# Patient Record
Sex: Male | Born: 1965 | Race: White | Hispanic: No | Marital: Married | Smoking: Former smoker
Health system: Southern US, Community
[De-identification: ages and names within clinical notes are randomized; demographics above are authoritative.]

## PROBLEM LIST (undated history)

## (undated) DIAGNOSIS — K219 Gastro-esophageal reflux disease without esophagitis: Secondary | ICD-10-CM

## (undated) DIAGNOSIS — Z9989 Dependence on other enabling machines and devices: Secondary | ICD-10-CM

## (undated) DIAGNOSIS — G4733 Obstructive sleep apnea (adult) (pediatric): Secondary | ICD-10-CM

## (undated) HISTORY — DX: Gastro-esophageal reflux disease without esophagitis: K21.9

## (undated) HISTORY — PX: OTHER SURGICAL HISTORY: SHX169

## (undated) HISTORY — DX: Dependence on other enabling machines and devices: Z99.89

## (undated) HISTORY — DX: Obstructive sleep apnea (adult) (pediatric): G47.33

---

## 2008-08-16 ENCOUNTER — Ambulatory Visit (HOSPITAL_COMMUNITY): Admission: RE | Admit: 2008-08-16 | Discharge: 2008-08-16 | Payer: Self-pay | Admitting: *Deleted

## 2008-08-16 ENCOUNTER — Encounter (INDEPENDENT_AMBULATORY_CARE_PROVIDER_SITE_OTHER): Payer: Self-pay | Admitting: *Deleted

## 2010-06-03 NOTE — Op Note (Signed)
Cole Simpson, Cole Simpson                ACCOUNT NO.:  [id number]   MEDICAL RECORD NO.:  [id number]          PATIENT TYPE:  AMB   LOCATION:  ENDO                         FACILITY:  [facility]   PHYSICIAN:  Georgiana Spinner, M.D.    DATE OF BIRTH:  08/07/1965   DATE OF PROCEDURE:  08/16/2008  DATE OF DISCHARGE:                               OPERATIVE REPORT   PROCEDURE:  Upper endoscopy with biopsy.   ENDOSCOPIST:  Georgiana Spinner, M.D.   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  He  had subsequently ablation therapy in 2005 and 2006.  He has had every-  six-month evaluations up until now.  He is due for a repeat endoscopy at  this time.   ANESTHESIA:  Fentanyl 100 mcg, Versed 10 mg.   DESCRIPTION OF PROCEDURE:  With the patient mildly sedated in the left  lateral decubitus position, the Pentax videoscopic endoscope was  inserted in the mouth and passed under direct vision through the  esophagus, which appeared normal.  Photographs were taken.  We entered  into the stomach through a hiatal hernia.  The fundus, body, antrum,  duodenal bulb and second portion of the duodenum were visualized.  From  this point the endoscope was slowly withdrawn, taking circumferential  views of the duodenal mucosa until the endoscope had been pulled back  into the stomach and placed in retroflexion, to view the stomach from  below.  There was one questionable button hole area of possible  Barrett's that was photographed and biopsied.  The endoscope was then  straightened and withdrawn, taking circumferential views of the  remaining gastric and esophageal mucosa.  The patient's vital signs and  pulse oximeter remained stable.   The patient tolerated the procedure well without apparent complications.   FINDINGS:  Hiatal hernia with one questionable area of possible  Barrett's, which may be a normal variant.  Await  biopsy report.  The  patient will call me for the results, and will follow up with  gastroenterology for at least an annual examination for the next five  years.           ______________________________  Georgiana Spinner, M.D.     GMO/MEDQ  D:  08/16/2008  T:  08/16/2008  Job:  [id number]

## 2015-11-21 DIAGNOSIS — Z0000 Encounter for general adult medical examination without abnormal findings: Secondary | ICD-10-CM | POA: Diagnosis not present

## 2015-11-21 DIAGNOSIS — Z23 Encounter for immunization: Secondary | ICD-10-CM | POA: Diagnosis not present

## 2016-01-20 DIAGNOSIS — Z9989 Dependence on other enabling machines and devices: Secondary | ICD-10-CM

## 2016-01-20 DIAGNOSIS — G4733 Obstructive sleep apnea (adult) (pediatric): Secondary | ICD-10-CM

## 2016-01-20 HISTORY — DX: Dependence on other enabling machines and devices: Z99.89

## 2016-01-20 HISTORY — DX: Obstructive sleep apnea (adult) (pediatric): G47.33

## 2016-05-22 DIAGNOSIS — R7301 Impaired fasting glucose: Secondary | ICD-10-CM | POA: Diagnosis not present

## 2016-05-27 DIAGNOSIS — Z0000 Encounter for general adult medical examination without abnormal findings: Secondary | ICD-10-CM | POA: Diagnosis not present

## 2016-07-20 DIAGNOSIS — K2270 Barrett's esophagus without dysplasia: Secondary | ICD-10-CM | POA: Diagnosis not present

## 2016-07-20 DIAGNOSIS — K449 Diaphragmatic hernia without obstruction or gangrene: Secondary | ICD-10-CM | POA: Diagnosis not present

## 2016-08-06 DIAGNOSIS — K2270 Barrett's esophagus without dysplasia: Secondary | ICD-10-CM | POA: Diagnosis not present

## 2016-08-06 DIAGNOSIS — K449 Diaphragmatic hernia without obstruction or gangrene: Secondary | ICD-10-CM | POA: Diagnosis not present

## 2016-08-06 DIAGNOSIS — K317 Polyp of stomach and duodenum: Secondary | ICD-10-CM | POA: Diagnosis not present

## 2016-11-02 DIAGNOSIS — R0683 Snoring: Secondary | ICD-10-CM | POA: Diagnosis not present

## 2016-11-30 DIAGNOSIS — Z125 Encounter for screening for malignant neoplasm of prostate: Secondary | ICD-10-CM | POA: Diagnosis not present

## 2016-11-30 DIAGNOSIS — Z0000 Encounter for general adult medical examination without abnormal findings: Secondary | ICD-10-CM | POA: Diagnosis not present

## 2016-12-04 DIAGNOSIS — Z0000 Encounter for general adult medical examination without abnormal findings: Secondary | ICD-10-CM | POA: Diagnosis not present

## 2016-12-04 DIAGNOSIS — R7301 Impaired fasting glucose: Secondary | ICD-10-CM | POA: Diagnosis not present

## 2016-12-14 ENCOUNTER — Encounter: Payer: Self-pay | Admitting: Neurology

## 2016-12-15 ENCOUNTER — Encounter: Payer: Self-pay | Admitting: Neurology

## 2016-12-15 ENCOUNTER — Encounter (INDEPENDENT_AMBULATORY_CARE_PROVIDER_SITE_OTHER): Payer: Self-pay

## 2016-12-15 ENCOUNTER — Ambulatory Visit: Payer: BLUE CROSS/BLUE SHIELD | Admitting: Neurology

## 2016-12-15 VITALS — BP 145/95 | HR 73 | Ht 65.0 in | Wt 217.0 lb

## 2016-12-15 DIAGNOSIS — G4733 Obstructive sleep apnea (adult) (pediatric): Secondary | ICD-10-CM

## 2016-12-15 DIAGNOSIS — R0683 Snoring: Secondary | ICD-10-CM | POA: Insufficient documentation

## 2016-12-15 NOTE — Progress Notes (Signed)
SLEEP MEDICINE CLINIC   Provider:  Melvyn Novasarmen  Abdulraheem Pineo, M D  Primary Care Physician:  Pearson GrippeKim, James, MD   Referring Provider: Pearson GrippeKim, James, MD   Chief Complaint  Patient presents with  . New Patient (Initial Visit)    pt alone, rm 10. pt has been told that he snores bad and gasp for air. he wakes up not feeling as rested.Marland Kitchen. He has 3 brothers that have sleep apnea.     HPI:  Cole Simpson is a 51 y.o. male , seen here  in a referral  from Dr. Selena BattenKim for a sleep consultation.  I had the pleasure of meeting Cole Simpson today, a 51 year old Caucasian, married, left-handed male patient of Dr. Pearson GrippeJames Kim on 15 December 2016. The patient is married to a nurse and reports that he has also occurred that his brothers have noted his snoring.  He has 4 brothers of which 3 are on CPAP. He has seasonal allergies culminating and allergic rhinitis, enlarged adenoids.  Aside from snoring and allergies he has suffered from GERD.  Chief complaint according to patient :" my wife and children are bothered by my snoring and I feel more tired in the morning"  Sleep habits are as follows:  The patient's usual bedtime is between 10 and 11 PM, his bedroom is cool, quiet and dark and he shares it with his wife.  He sleeps on 2 pillows usually on his sides, his wife has noticed that snoring is louder when he rests on his right side.   He has once a night a bathroom break.  Otherwise he will sleeps through until the morning hours when he spontaneously awakens around 5:50 AM, he also has an alarm set. He cannot recall vivid dreams, at least no lately.  He is just not as ready to start the day.  He awakens with a dry mouth, but neither headaches, no nausea no dizziness were endorsed.  He does not have palpitations, he is not sweaty or clammy and he does not have chest pain.   Sleep medical history and family sleep history: Cole Simpson has no history of sleepwalking, night terrors or enuresis.  He never underwent  tonsillectomy.  As mentioned above 3 of his 4 brothers use CPAP.  He also has 2 sisters.  Social history: married to a Charity fundraiserN, 3 children, all 3 sons in school, works for [employer]. He has daylight exposure at his workplace. Open office. Standing desk.  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  He smoked for about 2 or 3 years his 2420s but has not smoked in over 25 years.  He drinks usually only on weekends less than 7 drinks a week.  He drinks about 28 ounces of coffee per day, no energy drinks, sodas, rarely iced tea. Regular gym attendance.     Review of Systems: Out of a complete 14 system review, the patient complains of only the following symptoms, and all other reviewed systems are negative.  Snoring. Gasping , weight gain.  Epworth score  4 , Fatigue severity score 20  ,    Social History   Socioeconomic History  . Marital status: Married    Spouse name: Not on file  . Number of children: Not on file  . Years of education: Not on file  . Highest education level: Not on file  Social Needs  . Financial resource strain: Not on file  . Food insecurity - worry: Not  on file  . Food insecurity - inability: Not on file  . Transportation needs - medical: Not on file  . Transportation needs - non-medical: Not on file  Occupational History  . Not on file  Tobacco Use  . Smoking status: Former Games developermoker  . Smokeless tobacco: Never Used  . Tobacco comment: quit 20 years ago  Substance and Sexual Activity  . Alcohol use: Yes    Comment: occasionally  . Drug use: No  . Sexual activity: Not on file  Other Topics Concern  . Not on file  Social History Narrative  . Not on file    Family History  Problem Relation Age of Onset  . Brain cancer Mother   . Heart failure Father     Past Medical History:  Diagnosis Date  . GERD (gastroesophageal reflux disease)     Current Outpatient Medications  Medication Sig Dispense Refill  . Calcium Carb-Cholecalciferol  (CALCIUM 600 + D) 600-200 MG-UNIT TABS Take 1 tablet by mouth daily.    . fluticasone (FLONASE) 50 MCG/ACT nasal spray Place into both nostrils daily.    Marland Kitchen. GLUCOSAMINE-CHONDROIT-MSM-C-MN PO Take 1 tablet by mouth daily.    Boris Lown. Krill Oil 300 MG CAPS Take by mouth.    . loratadine (CLARITIN) 10 MG tablet Take 10 mg by mouth daily.    . Multiple Vitamin (MULTIVITAMIN) tablet Take 1 tablet by mouth daily.    Marland Kitchen. omeprazole (PRILOSEC) 20 MG capsule Take 20 mg by mouth 2 (two) times daily before a meal.     . SILDENAFIL CITRATE PO Take 20 mg by mouth as needed.    . vitamin C (ASCORBIC ACID) 500 MG tablet Take 500 mg by mouth daily.     No current facility-administered medications for this visit.     Allergies as of 12/15/2016  . (Not on File)    Vitals: BP (!) 145/95   Pulse 73   Ht 5\' 5"  (1.651 m)   Wt 217 lb (98.4 kg)   BMI 36.11 kg/m  Last Weight:  Wt Readings from Last 1 Encounters:  12/15/16 217 lb (98.4 kg)   ZOX:WRUEBMI:Body mass index is 36.11 kg/m.     Last Height:   Ht Readings from Last 1 Encounters:  12/15/16 5\' 5"  (1.651 m)    Physical exam:  General: The patient is awake, alert and appears not in acute distress. The patient is well groomed. Head: Normocephalic, atraumatic. Neck is supple. Mallampati 4 neck circumference:17. Nasal airflow congested . Retrognathia is seen.  Cardiovascular:  Regular rate and rhythm , without  murmurs or carotid bruit, and without distended neck veins. Respiratory: Lungs are clear to auscultation. Skin:  Without evidence of edema, or rash Trunk: BMI is 36. The patient's posture is erect   Neurologic exam : The patient is awake and alert, oriented to place and time.     Attention span & concentration ability appears normal.  Speech is fluent,  without  dysarthria, dysphonia or aphasia.  Mood and affect are appropriate.  Cranial nerves:  intact smell and taste. Pupils are equal and briskly reactive to light. Funduscopic exam without evidence  of pallor or edema. Extraocular movements  in vertical and horizontal planes intact and without nystagmus. Visual fields by finger perimetry are intact. Hearing to finger rub intact.  Facial sensation intact to fine touch. Facial motor strength is symmetric and tongue and uvula move midline. Shoulder shrug was symmetrical.   Motor exam:  Normal tone, muscle bulk and symmetric  strength in all extremities. Sensory:  Fine touch, pinprick and vibration were tested in all extremities. Proprioception tested in the upper extremities was normal. Coordination: Rapid alternating movements in the fingers/hands was normal. Finger-to-nose maneuver  normal without evidence of ataxia, dysmetria or tremor. Gait and station: Patient walks without assistive device and is able unassisted to climb up to the exam table. Strength within normal limits.Stance is stable and normal.  Turns with 3 Steps.   Deep tendon reflexes in the upper and lower extremities are symmetric and intact. Babinski maneuver response is downgoing.    Assessment:  After physical and neurologic examination, review of laboratory studies,  Personal review of imaging studies, reports of other /same  Imaging studies, results of polysomnography and / or neurophysiology testing and pre-existing records as far as provided in visit., my assessment is   1) Snoring and witnessed apnea in a patient without co-morbidities , but with anatomical risk factors( neck 17 , Mallampati, congested nasion) and strong family history.   The patient was advised of the nature of the diagnosed disorder , the treatment options and the  risks for general health and wellness arising from not treating the condition.   I spent more than 30 minutes of face to face time with the patient.  Greater than 50% of time was spent in counseling and coordination of care. We have discussed the diagnosis and differential and I answered the patient's questions.    Plan:  Treatment plan and  additional workup : HST ASAP- patient needs to be tested and in therapy before 01-18-2017.    Melvyn Novas, MD 12/15/2016, 8:16 AM  Certified in Neurology by ABPN Certified in Sleep Medicine by [facility] [address], [address] [place], [place] [address]

## 2016-12-15 NOTE — Patient Instructions (Signed)

## 2016-12-17 ENCOUNTER — Institutional Professional Consult (permissible substitution): Payer: Self-pay | Admitting: Neurology

## 2017-01-04 ENCOUNTER — Ambulatory Visit (INDEPENDENT_AMBULATORY_CARE_PROVIDER_SITE_OTHER): Payer: BLUE CROSS/BLUE SHIELD | Admitting: Neurology

## 2017-01-04 DIAGNOSIS — G4733 Obstructive sleep apnea (adult) (pediatric): Secondary | ICD-10-CM

## 2017-01-04 DIAGNOSIS — R0683 Snoring: Secondary | ICD-10-CM

## 2017-01-12 ENCOUNTER — Other Ambulatory Visit: Payer: Self-pay | Admitting: Neurology

## 2017-01-12 DIAGNOSIS — R0683 Snoring: Secondary | ICD-10-CM

## 2017-01-12 DIAGNOSIS — M2619 Other specified anomalies of jaw-cranial base relationship: Secondary | ICD-10-CM

## 2017-01-12 DIAGNOSIS — G4733 Obstructive sleep apnea (adult) (pediatric): Secondary | ICD-10-CM

## 2017-01-12 NOTE — Procedures (Signed)
[facility] [address] [place], [place] [address] NAME:  [name]                                                        DOB: 1965/11/30 MEDICAL RECORD NUMBER  [id number]                                   DOS: 01/04/2017- loaded 01-10-2017 REFERRING PHYSICIAN: Pearson GrippeJames Kim, M.D.   STUDY PERFORMED: HST on Watch Pat HISTORY: Mr. Cole HumphreyRobert J Simpson, a 51 year old Caucasian, married, left-handed male patient of Dr. Pearson GrippeJames Kim, is married to a nurse who reports that he has apnea. Similar observations have been made by his brothers, have noted his loud snoring.  He has 4 brothers of which 3 are on CPAP. He has seasonal allergies culminating and allergic rhinitis, enlarged adenoids and GERD. Epworth Sleepiness score endorsed at 4 points, Fatigue severity score at 20 points, BMI was 36.4    STUDY RESULTS: Total Recording Time: 7 hours, 19 minutes Total Apnea/Hypopnea Index (AHI):  15.9 /hr., RDI was 19.8/hr.  Average Oxygen Saturation: SpO2 93%; Lowest Oxygen Desaturation: 88%  Total Time Oxygen Saturation Below 89% was 0.0 minutes.  Average Heart Rate:   72 bpm (54-108 bpm)   IMPRESSION: Mild form of Obstructive Sleep Apnea with moderate Snoring.  Not associated with Hypoxemia nor associated with Tachy -Brady Arrhythmia.  RECOMMENDATION:  I will order auto CPAP 5-15 cm water with 3 cm EPR, heated humidity and mask of patient's choice.  Since the patient has Retrognathia, please consider a nasal mask or pillow. This uncomplicated OSA can be treated with a dental device, too- if CPAP is not tolerated.   I certify that I have reviewed the raw data recording prior to the issuance of this report in accordance with the standards of the American Academy of Sleep Medicine (AASM). Melvyn Novasarmen Shakeya Kerkman, M.D.   01-12-2017   Medical Director of [facility], Diplomat of ABPN and ABSM and accredited by AASM

## 2017-01-13 ENCOUNTER — Telehealth: Payer: Self-pay | Admitting: Neurology

## 2017-01-13 NOTE — Telephone Encounter (Signed)
Called patient to discuss sleep study results. No answer at this time. LVM for the patient to call back.   

## 2017-01-13 NOTE — Telephone Encounter (Signed)
-----   Message from Melvyn Novasarmen Dohmeier, MD sent at 01/12/2017  2:05 PM EST ----- Uncomplicated, straight forward form of mild OSA, AHI 15.9, loud snoring ( RDI was 19.8/hr.) I recommend to use auto CPAP with a non full face mask - 5- 15 cm water, 3 cm EPR and mask of choice ( retrognathia ! Use nasal or nasal pillow) .

## 2017-01-15 NOTE — Telephone Encounter (Signed)
I called pt. I advised pt that Dr. Vickey Hugerohmeier reviewed their sleep study results and found that pt has sleep apnea. Dr. Vickey Hugerohmeier recommends that pt starts a auto CPAP. I reviewed PAP compliance expectations with the pt. Pt is agreeable to starting an auto-PAP. I advised pt that an order will be sent to a DME, [facility], and [facility] will call the pt within about one week after they file with the pt's insurance. [facility] will show the pt how to use the machine, fit for masks, and troubleshoot the auto-PAP if needed. A follow up appt was made for insurance purposes with Darrol Angelarolyn Martin, NP on April 15, 2017 at 8:45 am. Pt verbalized understanding to arrive 15 minutes early and bring their auto-PAP. A letter with all of this information in it will be mailed to the pt as a reminder. I verified with the pt that the address we have on file is correct. Pt verbalized understanding of results. Pt had no questions at this time but was encouraged to call back if questions arise.

## 2017-01-26 ENCOUNTER — Telehealth: Payer: Self-pay | Admitting: Neurology

## 2017-01-26 NOTE — Telephone Encounter (Signed)
Spoke with [facility] and they will contact the patient today.

## 2017-01-26 NOTE — Telephone Encounter (Signed)
Pt lm stating he was told he would be referred to a company to get equipment. I am assuming pt has yet to hear from DME company. I wanted to check with you first to make sure order was sent.

## 2017-01-26 NOTE — Telephone Encounter (Signed)
Called the patient to verify that [employer] did reach out to them. Pt states that they did and he appreciated me reaching out to them

## 2017-01-29 DIAGNOSIS — K219 Gastro-esophageal reflux disease without esophagitis: Secondary | ICD-10-CM | POA: Diagnosis not present

## 2017-02-01 ENCOUNTER — Telehealth: Payer: Self-pay | Admitting: Neurology

## 2017-02-01 NOTE — Telephone Encounter (Signed)
Called the patient and made him aware that I was told by [employer] that they personally spoke with him on 8th per my documentation.  The patient voiced that since that last spoke with [facility] he was told that they would reach out to him once he got insurance approval and he has yet to hear from them and its been a wk. I informed him that it can sometimes take a while for insurance approval. I asked it the patient had spoke with [employer] and he states no he just called us. I instructed him that he needs to call and speak with [employer] and voice his concerns to them. I offered to also send a email to them and make them aware.  I received an email that [employer] has scheduled him today.

## 2017-02-01 NOTE — Telephone Encounter (Signed)
Pt lvm stating he has not heard from [employer] since two weeks ago when they told him they had to contact his insurance. He stated at this time he is unhappy with [employer]. He would like for you to give him a call back. He gave his work number which is [phone] and also his cell number.

## 2017-02-05 DIAGNOSIS — G4733 Obstructive sleep apnea (adult) (pediatric): Secondary | ICD-10-CM | POA: Diagnosis not present

## 2017-03-08 DIAGNOSIS — G4733 Obstructive sleep apnea (adult) (pediatric): Secondary | ICD-10-CM | POA: Diagnosis not present

## 2017-04-05 DIAGNOSIS — G4733 Obstructive sleep apnea (adult) (pediatric): Secondary | ICD-10-CM | POA: Diagnosis not present

## 2017-04-06 NOTE — Progress Notes (Signed)
[facility]  PATIENT: Cole Simpson   REASON FOR VISIT: Follow-up for obstructive sleep apnea with initial CPAP HISTORY FROM: Patient    HISTORY OF PRESENT ILLNESS:UPDATE 3/28/2019CM Cole Simpson, 52 year old male returns for follow-up for initial CPAP compliance after recently being diagnosed with obstructive sleep apnea.  He is having no problems with adjustment.  He has nasal pillows.  Compliance data dated 03/16/2017-04/14/2017 shows compliance greater than 4 hours for 30 days at 100%.  Average usage 7 hours 37 minutes.  Set pressure 5-15 cm.  EPR level 3 AHI 0.8.  ESS 2.  He returns for reevaluation 12/15/17 CDRobert J Cole Simpson is a 52 y.o. male , seen here  in a referral  from Dr. Selena BattenKim for a sleep consultation.  I had the pleasure of meeting Cole Simpson today, a 52 year old Caucasian, married, left-handed male patient of Dr. Pearson GrippeJames Kim on 15 December 2016. The patient is married to a nurse and reports that he has also occurred that his brothers have noted his snoring.  He has 4 brothers of which 3 are on CPAP. He has seasonal allergies culminating and allergic rhinitis, enlarged adenoids.  Aside from snoring and allergies he has suffered from GERD.  Chief complaint according to patient :" my wife and children are bothered by my snoring and I feel more tired in the morning"  Sleep habits are as follows:  The patient's usual bedtime is between 10 and 11 PM, his bedroom is cool, quiet and dark and he shares it with his wife.  He sleeps on 2 pillows usually on his sides, his wife has noticed that snoring is louder when he rests on his right side.   He has once a night a bathroom break.  Otherwise he will sleeps through until the morning hours when he spontaneously awakens around 5:50 AM, he also has an alarm set. He cannot recall vivid dreams, at least no lately.  He is just not as ready to start the day.  He awakens with a dry mouth, but neither  headaches, no nausea no dizziness were endorsed.  He does not have palpitations, he is not sweaty or clammy and he does not have chest pain.   REVIEW OF SYSTEMS: Full 14 system review of systems performed and notable only for those listed, all others are neg:  Constitutional: neg  Cardiovascular: neg Ear/Nose/Throat: neg  Skin: neg Eyes: neg Respiratory: neg Gastroitestinal: neg  Hematology/Lymphatic: neg  Endocrine: neg Musculoskeletal:neg Allergy/Immunology: neg Neurological: neg Psychiatric: neg Sleep : Obstructive sleep apnea new to CPAP   ALLERGIES: No Known Allergies  HOME MEDICATIONS: Outpatient Medications Prior to Visit  Medication Sig Dispense Refill  . Calcium Carb-Cholecalciferol (CALCIUM 600 + D) 600-200 MG-UNIT TABS Take 1 tablet by mouth daily.    . fluticasone (FLONASE) 50 MCG/ACT nasal spray Place into both nostrils daily.    Marland Kitchen. GLUCOSAMINE-CHONDROIT-MSM-C-MN PO Take 1 tablet by mouth daily.    Cole Simpson. Krill Oil 300 MG CAPS Take by mouth.    . loratadine (CLARITIN) 10 MG tablet Take 10 mg by mouth daily.    . Multiple Vitamin (MULTIVITAMIN) tablet Take 1 tablet by mouth daily.    Marland Kitchen. omeprazole (PRILOSEC) 20 MG capsule Take 20 mg by mouth 2 (two) times daily before a meal.     . SILDENAFIL CITRATE PO Take 20 mg by mouth as needed.    . vitamin C (ASCORBIC ACID) 500 MG tablet Take 500 mg by mouth daily.  No facility-administered medications prior to visit.     PAST MEDICAL HISTORY: Past Medical History:  Diagnosis Date  . GERD (gastroesophageal reflux disease)   . OSA on CPAP 2018    PAST SURGICAL HISTORY: History reviewed. No pertinent surgical history.  FAMILY HISTORY: Family History  Problem Relation Age of Onset  . Brain cancer Mother   . Heart failure Father     SOCIAL HISTORY: Social History   Socioeconomic History  . Marital status: Married    Spouse name: Not on file  . Number of children: Not on file  . Years of education: Not on file   . Highest education level: Not on file  Occupational History  . Not on file  Social Needs  . Financial resource strain: Not on file  . Food insecurity:    Worry: Not on file    Inability: Not on file  . Transportation needs:    Medical: Not on file    Non-medical: Not on file  Tobacco Use  . Smoking status: Former Games developer  . Smokeless tobacco: Never Used  . Tobacco comment: quit 20 years ago  Substance and Sexual Activity  . Alcohol use: Yes    Comment: occasionally  . Drug use: No  . Sexual activity: Not on file  Lifestyle  . Physical activity:    Days per week: Not on file    Minutes per session: Not on file  . Stress: Not on file  Relationships  . Social connections:    Talks on phone: Not on file    Gets together: Not on file    Attends religious service: Not on file    Active member of club or organization: Not on file    Attends meetings of clubs or organizations: Not on file    Relationship status: Not on file  . Intimate partner violence:    Fear of current or ex partner: Not on file    Emotionally abused: Not on file    Physically abused: Not on file    Forced sexual activity: Not on file  Other Topics Concern  . Not on file  Social History Narrative  . Not on file     PHYSICAL EXAM  Vitals:   04/15/17 0821  BP: (!) 150/81  Pulse: 74  Weight: 210 lb 9.6 oz (95.5 kg)   Body mass index is 35.05 kg/m.  Generalized: Well developed, obese male in no acute distress  Head: normocephalic and atraumatic,. Oropharynx benign mallopatti 4 Neck: Supple, neck circumference 17 Musculoskeletal: No deformity   Neurological examination   Mentation: Alert oriented to time, place, history taking. Attention span and concentration appropriate. Recent and remote memory intact.  Follows all commands speech and language fluent.   Cranial nerve II-XII: Pupils were equal round reactive to light extraocular movements were full, visual field were full on confrontational  test. Facial sensation and strength were normal. hearing was intact to finger rubbing bilaterally. Uvula tongue midline. head turning and shoulder shrug were normal and symmetric.Tongue protrusion into cheek strength was normal. Motor: normal bulk and tone, full strength in the BUE, BLE, fine finger movements normal, no pronator drift. No focal weakness Sensory: normal and symmetric to light touch,  Coordination: finger-nose-finger, heel-to-shin bilaterally, no dysmetria Gait and Station: Rising up from seated position without assistance, normal stance DIAGNOSTIC DATA (LABS, IMAGING, TESTING) - I reviewed patient records, labs, notes, testing and imaging myself where available.   ASSESSMENT AND PLAN  52 y.o. year old male  has a past medical history of GERD (gastroesophageal reflux disease) and OSA on CPAP (2018). here for initial CPAP compliance.Data dated 03/16/2017-04/14/2017 shows compliance greater than 4 hours for 30 days at 100%.  Average usage 7 hours 37 minutes.  Set pressure 5-15 cm.  EPR level 3 AHI 0.8.  ESS 2   CPAP compliance 100% reviewed data with patient Continue same settings Follow-up yearly and as needed I spent 17 min in total face to face time with the patient more than 50% of which was spent counseling and coordination of care, reviewing test results reviewing medications and discussing and reviewing the diagnosis of obstructive sleep apnea and reviewing CPAP compliance Nilda Riggs, [facility], [facility], APRN  [facility] [address], [address] [place], [place] [address] [phone]

## 2017-04-14 ENCOUNTER — Encounter: Payer: Self-pay | Admitting: Nurse Practitioner

## 2017-04-15 ENCOUNTER — Ambulatory Visit: Payer: BLUE CROSS/BLUE SHIELD | Admitting: Nurse Practitioner

## 2017-04-15 ENCOUNTER — Encounter: Payer: Self-pay | Admitting: Nurse Practitioner

## 2017-04-15 DIAGNOSIS — Z9989 Dependence on other enabling machines and devices: Secondary | ICD-10-CM

## 2017-04-15 DIAGNOSIS — G4733 Obstructive sleep apnea (adult) (pediatric): Secondary | ICD-10-CM

## 2017-04-15 NOTE — Patient Instructions (Signed)
CPAP compliance 100% Continue same settings Follow-up yearly and as needed  

## 2017-04-15 NOTE — Progress Notes (Signed)
I agree with the assessment and plan as directed by NP .The patient is known to me .   Albirda Shiel, MD  

## 2017-05-06 DIAGNOSIS — G4733 Obstructive sleep apnea (adult) (pediatric): Secondary | ICD-10-CM | POA: Diagnosis not present

## 2017-06-05 DIAGNOSIS — G4733 Obstructive sleep apnea (adult) (pediatric): Secondary | ICD-10-CM | POA: Diagnosis not present

## 2017-07-06 DIAGNOSIS — G4733 Obstructive sleep apnea (adult) (pediatric): Secondary | ICD-10-CM | POA: Diagnosis not present

## 2017-08-04 DIAGNOSIS — K2270 Barrett's esophagus without dysplasia: Secondary | ICD-10-CM | POA: Diagnosis not present

## 2017-08-05 DIAGNOSIS — G4733 Obstructive sleep apnea (adult) (pediatric): Secondary | ICD-10-CM | POA: Diagnosis not present

## 2017-08-31 DIAGNOSIS — K2270 Barrett's esophagus without dysplasia: Secondary | ICD-10-CM | POA: Diagnosis not present

## 2017-08-31 DIAGNOSIS — K208 Other esophagitis: Secondary | ICD-10-CM | POA: Diagnosis not present

## 2017-08-31 DIAGNOSIS — K449 Diaphragmatic hernia without obstruction or gangrene: Secondary | ICD-10-CM | POA: Diagnosis not present

## 2017-08-31 DIAGNOSIS — K228 Other specified diseases of esophagus: Secondary | ICD-10-CM | POA: Diagnosis not present

## 2017-09-05 DIAGNOSIS — G4733 Obstructive sleep apnea (adult) (pediatric): Secondary | ICD-10-CM | POA: Diagnosis not present

## 2017-10-06 DIAGNOSIS — G4733 Obstructive sleep apnea (adult) (pediatric): Secondary | ICD-10-CM | POA: Diagnosis not present

## 2017-11-05 DIAGNOSIS — G4733 Obstructive sleep apnea (adult) (pediatric): Secondary | ICD-10-CM | POA: Diagnosis not present

## 2018-01-27 DIAGNOSIS — Z79899 Other long term (current) drug therapy: Secondary | ICD-10-CM | POA: Diagnosis not present

## 2018-01-27 DIAGNOSIS — Z0000 Encounter for general adult medical examination without abnormal findings: Secondary | ICD-10-CM | POA: Diagnosis not present

## 2018-01-27 DIAGNOSIS — Z87891 Personal history of nicotine dependence: Secondary | ICD-10-CM | POA: Diagnosis not present

## 2018-03-15 DIAGNOSIS — K219 Gastro-esophageal reflux disease without esophagitis: Secondary | ICD-10-CM | POA: Diagnosis not present

## 2018-03-15 DIAGNOSIS — Z8719 Personal history of other diseases of the digestive system: Secondary | ICD-10-CM | POA: Diagnosis not present

## 2018-03-15 DIAGNOSIS — Z8371 Family history of colonic polyps: Secondary | ICD-10-CM | POA: Diagnosis not present

## 2018-04-18 ENCOUNTER — Ambulatory Visit: Payer: BLUE CROSS/BLUE SHIELD | Admitting: Neurology

## 2018-04-18 ENCOUNTER — Ambulatory Visit: Payer: BLUE CROSS/BLUE SHIELD | Admitting: Nurse Practitioner

## 2018-05-09 DIAGNOSIS — Z23 Encounter for immunization: Secondary | ICD-10-CM | POA: Diagnosis not present

## 2018-05-09 DIAGNOSIS — K219 Gastro-esophageal reflux disease without esophagitis: Secondary | ICD-10-CM | POA: Diagnosis not present

## 2018-05-09 DIAGNOSIS — J309 Allergic rhinitis, unspecified: Secondary | ICD-10-CM | POA: Diagnosis not present

## 2018-06-14 ENCOUNTER — Telehealth: Payer: Self-pay

## 2018-06-14 NOTE — Telephone Encounter (Signed)
Unable to get in contact with the patient to convert their office visit with Amy on 06/21/2018 into a doxy.me visit. I left a voicemail asking the patient to return my call. Office number was provided.   If patient calls back please convert their office visit into a doxy.me visit.   

## 2018-06-20 NOTE — Telephone Encounter (Signed)
Spoke with the patient and they have given verbal consent to file their insurance and to do a doxy[employer]me visit. E-mail has been confirmed and sent.  E-mail: [email or web address]

## 2018-06-21 ENCOUNTER — Encounter: Payer: Self-pay | Admitting: Family Medicine

## 2018-06-21 ENCOUNTER — Other Ambulatory Visit: Payer: Self-pay

## 2018-06-21 ENCOUNTER — Ambulatory Visit (INDEPENDENT_AMBULATORY_CARE_PROVIDER_SITE_OTHER): Payer: BC Managed Care – PPO | Admitting: Family Medicine

## 2018-06-21 DIAGNOSIS — G4733 Obstructive sleep apnea (adult) (pediatric): Secondary | ICD-10-CM | POA: Diagnosis not present

## 2018-06-21 DIAGNOSIS — Z9989 Dependence on other enabling machines and devices: Secondary | ICD-10-CM | POA: Diagnosis not present

## 2018-06-21 NOTE — Progress Notes (Signed)
PATIENT: Cole Simpson DOB: March 17, 1965  REASON FOR VISIT: follow up HISTORY FROM: patient  Virtual Visit via Telephone Note  I connected with Cole Simpson on 06/21/18 at  8:30 AM EDT by telephone and verified that I am speaking with the correct person using two identifiers.   I discussed the limitations, risks, security and privacy concerns of performing an evaluation and management service by telephone and the availability of in person appointments. I also discussed with the patient that there may be a patient responsible charge related to this service. The patient expressed understanding and agreed to proceed.   History of Present Illness:  06/21/18 Cole Simpson is a 53 y.o. male for follow up of OSA on CPAP.  He states that he is doing well with CPAP therapy.  He does use his machine nearly every night.  He admits that occasionally when he goes to the beach he does not take his CPAP therapy.  He is in need of supplies today.  05/21/2018 - 06/19/2018 020 Usage days 27/30 days (90%) >= 4 hours 27 days (90%) < 4 hours 0 days (0%) Usage hours 222 hours 5 minutes Average usage (total days) 7 hours 24 minutes Average usage (days used) 8 hours 14 minutes Median usage (days used) 8 hours 9 minutes Total used hours (value since last reset - 06/19/2018) 3,740 hours AirSense 10 AutoSet Serial number [id number] Mode AutoSet Min Pressure 5 cmH2O Max Pressure 15 cmH2O EPR Fulltime EPR level 3 Response Standard Therapy Pressure - cmH2O Median: 6.7 95th percentile: 9.6 Maximum: 10.9 Leaks - L/min Median: 0.0 95th percentile: 1.3 Maximum: 5.9 Events per hour AI: 0.6 HI: 0.1 AHI: 0.7 Apnea Index Central: 0.0 Obstructive: 0.6 Unknown: 0.0 RERA Index 0.2 Cheyne-Stokes respiration (average duration per night) 0 minutes (0%) Usage - hours Printed on  HISTORY (copied from Ewing Martin's note on 04/15/2017)  UPDATE 3/28/2019CM Mr. Mor, 53 year old male returns for follow-up  for initial CPAP compliance after recently being diagnosed with obstructive sleep apnea.  He is having no problems with adjustment.  He has nasal pillows.  Compliance data dated 03/16/2017-04/14/2017 shows compliance greater than 4 hours for 30 days at 100%.  Average usage 7 hours 37 minutes.  Set pressure 5-15 cm.  EPR level 3 AHI 0.8.  ESS 2.  He returns for reevaluation 12/15/17 CDRobert J Ellisis a 53 y.o.male, seen here in a referral from Dr. Saunders Glance a sleep consultation.  I had the pleasure of meeting Mr. Aesop Millhouse, a 53 year old Caucasian, married, left-handed male patient of Dr. Pearson Grippe on 15 December 2016. The patient is married to a nurse and reports that he has also occurred that his brothers have noted his snoring. He has 4 brothers of which 3 are on CPAP. He has seasonal allergies culminating and allergic rhinitis, enlarged adenoids.Aside from snoring and allergies he has suffered from GERD.  Chief complaint according to patient :" my wife and children are bothered by my snoring and I feel more tired in the morning"  Sleep habits are as follows: The patient's usual bedtime is between 10 and 11 PM, his bedroom is cool, quiet and dark and he shares it with his wife. He sleeps on 2 pillows usually on his sides,his wife has noticed that snoring is louder when he rests on his right side.  He has once a night a bathroom break. Otherwise he will sleeps through until the morning hours when he spontaneously awakens around 5:50 AM,he also has an alarm  set. He cannot recall vivid dreams, at least no lately.  He is just not as ready to start the day.He awakens with a dry mouth, but neither headaches, no nausea no dizziness were endorsed. He does not have palpitations, he is not sweaty or clammy and he does not have chest pain.   Observations/Objective:  Generalized: Well developed, in no acute distress  Mentation: Alert oriented to time, place, history taking.  Follows all commands speech and language fluent   Assessment and Plan:  53 y.o. year old male  has a past medical history of GERD (gastroesophageal reflux disease) and OSA on CPAP (2018). here with    ICD-10-CM   1. Obstructive sleep apnea treated with continuous positive airway pressure (CPAP) G47.33    Z99.89    Download shows optimal compliance. He was encouraged to continue nightly use of CPAP therapy and for greater than 4 hours each night.  I will send an order to his DME company for supplies today.  I recommend annual follow-up.  He verbalizes understanding and agreement with this plan.  No orders of the defined types were placed in this encounter.   No orders of the defined types were placed in this encounter.    Follow Up Instructions:  I discussed the assessment and treatment plan with the patient. The patient was provided an opportunity to ask questions and all were answered. The patient agreed with the plan and demonstrated an understanding of the instructions.   The patient was advised to call back or seek an in-person evaluation if the symptoms worsen or if the condition fails to improve as anticipated.  I provided 15 minutes of non-face-to-face time during this encounter.  Patient is located at his place of residence during video visit.  Provider is located at her place of residence.  Rozell SearingBrittany Duff, CMA helped to facilitate visit.   Shawnie DapperAmy Idalia Allbritton, NP

## 2018-06-24 DIAGNOSIS — G4733 Obstructive sleep apnea (adult) (pediatric): Secondary | ICD-10-CM | POA: Diagnosis not present

## 2018-08-01 DIAGNOSIS — K2270 Barrett's esophagus without dysplasia: Secondary | ICD-10-CM | POA: Diagnosis not present

## 2018-08-01 DIAGNOSIS — Z1211 Encounter for screening for malignant neoplasm of colon: Secondary | ICD-10-CM | POA: Diagnosis not present

## 2018-09-08 DIAGNOSIS — D123 Benign neoplasm of transverse colon: Secondary | ICD-10-CM | POA: Diagnosis not present

## 2018-09-08 DIAGNOSIS — K228 Other specified diseases of esophagus: Secondary | ICD-10-CM | POA: Diagnosis not present

## 2018-09-08 DIAGNOSIS — K5730 Diverticulosis of large intestine without perforation or abscess without bleeding: Secondary | ICD-10-CM | POA: Diagnosis not present

## 2018-09-08 DIAGNOSIS — K449 Diaphragmatic hernia without obstruction or gangrene: Secondary | ICD-10-CM | POA: Diagnosis not present

## 2018-09-08 DIAGNOSIS — K635 Polyp of colon: Secondary | ICD-10-CM | POA: Diagnosis not present

## 2018-09-08 DIAGNOSIS — Z1211 Encounter for screening for malignant neoplasm of colon: Secondary | ICD-10-CM | POA: Diagnosis not present

## 2018-09-08 DIAGNOSIS — K2270 Barrett's esophagus without dysplasia: Secondary | ICD-10-CM | POA: Diagnosis not present

## 2018-09-13 DIAGNOSIS — K22719 Barrett's esophagus with dysplasia, unspecified: Secondary | ICD-10-CM | POA: Diagnosis not present

## 2018-09-13 DIAGNOSIS — K219 Gastro-esophageal reflux disease without esophagitis: Secondary | ICD-10-CM | POA: Diagnosis not present

## 2018-09-13 DIAGNOSIS — Z86010 Personal history of colonic polyps: Secondary | ICD-10-CM | POA: Diagnosis not present

## 2018-10-17 DIAGNOSIS — Z125 Encounter for screening for malignant neoplasm of prostate: Secondary | ICD-10-CM | POA: Diagnosis not present

## 2018-10-17 DIAGNOSIS — Z0000 Encounter for general adult medical examination without abnormal findings: Secondary | ICD-10-CM | POA: Diagnosis not present

## 2018-10-24 DIAGNOSIS — Z23 Encounter for immunization: Secondary | ICD-10-CM | POA: Diagnosis not present

## 2018-10-24 DIAGNOSIS — Z0000 Encounter for general adult medical examination without abnormal findings: Secondary | ICD-10-CM | POA: Diagnosis not present

## 2019-01-27 DIAGNOSIS — M1990 Unspecified osteoarthritis, unspecified site: Secondary | ICD-10-CM | POA: Diagnosis not present

## 2019-01-27 DIAGNOSIS — Z0000 Encounter for general adult medical examination without abnormal findings: Secondary | ICD-10-CM | POA: Diagnosis not present

## 2019-01-27 DIAGNOSIS — K219 Gastro-esophageal reflux disease without esophagitis: Secondary | ICD-10-CM | POA: Diagnosis not present

## 2019-03-30 DIAGNOSIS — Z23 Encounter for immunization: Secondary | ICD-10-CM | POA: Diagnosis not present

## 2019-05-02 DIAGNOSIS — G4733 Obstructive sleep apnea (adult) (pediatric): Secondary | ICD-10-CM | POA: Diagnosis not present

## 2019-06-02 DIAGNOSIS — R42 Dizziness and giddiness: Secondary | ICD-10-CM | POA: Diagnosis not present

## 2019-06-20 NOTE — Progress Notes (Signed)
PATIENT: Cole Simpson DOB: [birth year]  REASON FOR VISIT: follow up HISTORY FROM: patient  Chief Complaint  Patient presents with  . Follow-up    alone, [facility], cpap, pt states he is doing well on cpap      HISTORY OF PRESENT ILLNESS: Today 06/21/19 Cole Simpson is a 54 y.o. male here today for follow up for OSA on CPAP.  He is doing very well with CPAP at home.  He is using CPAP nightly.  He does note significant in sleep quality.  He denies any concerns today.  Compliance report dated 12/20/2019 through 06/18/2019 reveals that he used CPAP thirty of the past 30 days for compliance of 100%.  The CPAP greater than 4 hours 100% of the time.  Average usage was 7 hours and 53 minutes.  Residual AHI was 0.5 on 5 to 15 cm of water and an EPR of three.  There is no significant leak noted.  HISTORY: (copied from my note on 06/21/2018)  Cole Simpson is a 54 y.o. male for follow up of OSA on CPAP.  He states that he is doing well with CPAP therapy.  He does use his machine nearly every night.  He admits that occasionally when he goes to the beach he does not take his CPAP therapy.  He is in need of supplies today.  05/21/2018 - 06/19/2018 020 Usage days 27/30 days (90%) >= 4 hours 27 days (90%) < 4 hours 0 days (0%) Usage hours 222 hours 5 minutes Average usage (total days) 7 hours 24 minutes Average usage (days used) 8 hours 14 minutes Median usage (days used) 8 hours 9 minutes Total used hours (value since last reset - 06/19/2018) 3,740 hours AirSense 10 AutoSet Serial number [id number] Mode AutoSet Min Pressure 5 cmH2O Max Pressure 15 cmH2O EPR Fulltime EPR level 3 Response Standard Therapy Pressure - cmH2O Median: 6.7 95th percentile: 9.6 Maximum: 10.9 Leaks - L/min Median: 0.0 95th percentile: 1.3 Maximum: 5.9 Events per hour AI: 0.6 HI: 0.1 AHI: 0.7 Apnea Index Central: 0.0 Obstructive: 0.6 Unknown: 0.0 RERA Index 0.2 Cheyne-Stokes respiration (average duration  per night) 0 minutes (0%) Usage - hours Printed on  HISTORY (copied from [employer] note on 04/15/2017)  UPDATE3/28/2019CMMr. Lafon, 54 year old male returns for follow-up for initial CPAP compliance after recently being diagnosed with obstructive sleep apnea. He is having no problems with adjustment. He has nasal pillows. Compliance data dated 03/16/2017-04/14/2017 shows compliance greater than 4 hours for 30 days at 100%. Average usage 7 hours 37 minutes. Set pressure 5-15 cm. EPR level 3 AHI 0.8. ESS 2. He returns for reevaluation 12/15/17 CDRobert J Ellisis a 54 y.o.male, seen here in a referral from Dr. Saunders Glance a sleep consultation.  I had the pleasure of meeting Cole Simpson, a 54 year old Caucasian, married, left-handed male patient of Dr. Pearson Grippe on 15 December 2016. The patient is married to a nurse and reports that he has also occurred that his brothers have noted his snoring. He has 4 brothers of which 3 are on CPAP. He has seasonal allergies culminating and allergic rhinitis, enlarged adenoids.Aside from snoring and allergies he has suffered from GERD.  Chief complaint according to patient :" my wife and children are bothered by my snoring and I feel more tired in the morning"  Sleep habits are as follows: The patient's usual bedtime is between 10 and 11 PM, his bedroom is cool, quiet and dark and he shares it with his  wife. He sleeps on 2 pillows usually on his sides,his wife has noticed that snoring is louder when he rests on his right side.  He has once a night a bathroom break. Otherwise he will sleeps through until the morning hours when he spontaneously awakens around 5:50 AM,he also has an alarm set. He cannot recall vivid dreams, at least no lately.  He is just not as ready to start the day.He awakens with a dry mouth, but neither headaches, no nausea no dizziness were endorsed. He does not have palpitations, he is not sweaty or  clammy and he does not have chest pain.  REVIEW OF SYSTEMS: Out of a complete 14 system review of symptoms, the patient complains only of the following symptoms, none and all other reviewed systems are negative.  ESS: 1 FSS: 25  ALLERGIES: No Known Allergies  HOME MEDICATIONS: Outpatient Medications Prior to Visit  Medication Sig Dispense Refill  . Calcium Carb-Cholecalciferol (CALCIUM 600 + D) 600-200 MG-UNIT TABS Take 1 tablet by mouth daily.    . fluticasone (FLONASE) 50 MCG/ACT nasal spray Place into both nostrils daily.    Cole Simpson Kitchen GLUCOSAMINE-CHONDROIT-MSM-C-MN PO Take 1 tablet by mouth daily.    Cole Simpson Oil 300 MG CAPS Take by mouth.    . loratadine (CLARITIN) 10 MG tablet Take 10 mg by mouth daily.    . Multiple Vitamin (MULTIVITAMIN) tablet Take 1 tablet by mouth daily.    Cole Simpson Kitchen omeprazole (PRILOSEC) 20 MG capsule Take 20 mg by mouth 2 (two) times daily before a meal.     . SILDENAFIL CITRATE PO Take 20 mg by mouth as needed.    . vitamin C (ASCORBIC ACID) 500 MG tablet Take 500 mg by mouth daily.     No facility-administered medications prior to visit.    PAST MEDICAL HISTORY: Past Medical History:  Diagnosis Date  . GERD (gastroesophageal reflux disease)   . OSA on CPAP 2018    PAST SURGICAL HISTORY: History reviewed. No pertinent surgical history.  FAMILY HISTORY: Family History  Problem Relation Age of Onset  . Brain cancer Mother   . Heart failure Father     SOCIAL HISTORY: Social History   Socioeconomic History  . Marital status: Married    Spouse name: Not on file  . Number of children: Not on file  . Years of education: Not on file  . Highest education level: Not on file  Occupational History  . Not on file  Tobacco Use  . Smoking status: Former Research scientist (life sciences)  . Smokeless tobacco: Never Used  . Tobacco comment: quit 20 years ago  Substance and Sexual Activity  . Alcohol use: Yes    Comment: occasionally  . Drug use: No  . Sexual activity: Not on file   Other Topics Concern  . Not on file  Social History Narrative  . Not on file   Social Determinants of Health   Financial Resource Strain:   . Difficulty of Paying Living Expenses:   Food Insecurity:   . Worried About Charity fundraiser in the Last Year:   . Arboriculturist in the Last Year:   Transportation Needs:   . Film/video editor (Medical):   Cole Simpson Kitchen Lack of Transportation (Non-Medical):   Physical Activity:   . Days of Exercise per Week:   . Minutes of Exercise per Session:   Stress:   . Feeling of Stress :   Social Connections:   . Frequency of Communication with Friends and  Family:   . Frequency of Social Gatherings with Friends and Family:   . Attends Religious Services:   . Active Member of Clubs or Organizations:   . Attends Banker Meetings:   Cole Simpson Kitchen Marital Status:   Intimate Partner Violence:   . Fear of Current or Ex-Partner:   . Emotionally Abused:   Cole Simpson Kitchen Physically Abused:   . Sexually Abused:       PHYSICAL EXAM  Vitals:   06/21/19 0725  BP: (!) 142/91  Pulse: 74  Weight: 213 lb (96.6 kg)  Height: 5\' 5"  (1.651 m)   Body mass index is 35.45 kg/m.  Generalized: Well developed, in no acute distress  Cardiology: normal rate and rhythm, no murmur noted Respiratory: clear to auscultation bilaterally  Neurological examination  Mentation: Alert oriented to time, place, history taking. Follows all commands speech and language fluent Cranial nerve II-XII: Pupils were equal round reactive to light. Extraocular movements were full, visual field were full  Motor: The motor testing reveals 5 over 5 strength of all 4 extremities. Good symmetric motor tone is noted throughout.  Gait and station: Gait is normal.   DIAGNOSTIC DATA (LABS, IMAGING, TESTING) - I reviewed patient records, labs, notes, testing and imaging myself where available.  No flowsheet data found.   No results found for: WBC, HGB, HCT, MCV, PLT No results found for: NA, K, CL,  CO2, GLUCOSE, BUN, CREATININE, CALCIUM, PROT, ALBUMIN, AST, ALT, ALKPHOS, BILITOT, GFRNONAA, GFRAA No results found for: CHOL, HDL, LDLCALC, LDLDIRECT, TRIG, CHOLHDL No results found for: No results found for: VITAMINB12 No results found for: TSH     ASSESSMENT AND PLAN 54 y.o. year old male  has a past medical history of GERD (gastroesophageal reflux disease) and OSA on CPAP (2018). here with     ICD-10-CM   1. Obstructive sleep apnea treated with continuous positive airway pressure (CPAP)  G47.33 For home use only DME continuous positive airway pressure (CPAP)   Z99.89      He continues to do very well with CPAP therapy. Compliance report reveals excellent compliance. He was encouraged to continue CPAP nightly and greater than 4 hours each night. Supply orders placed today. Healthy lifestyle habits encouraged. He will follow up in 1 year, sooner if needed. He verbalizes understanding and agreement with this plan.   Orders Placed This Encounter  Procedures  . For home use only DME continuous positive airway pressure (CPAP)    Supplies    Order Specific Question:   Length of Need    Answer:   Lifetime    Order Specific Question:   Patient has OSA or probable OSA    Answer:   Yes    Order Specific Question:   Is the patient currently using CPAP in the home    Answer:   Yes    Order Specific Question:   Settings    Answer:   Other see comments    Order Specific Question:   CPAP supplies needed    Answer:   Mask, headgear, cushions, filters, heated tubing and water chamber     No orders of the defined types were placed in this encounter.     I spent 15 minutes with the patient. 50% of this time was spent counseling and educating patient on plan of care and medications.    02-16-1978, FNP-C 06/21/2019, 7:31 AM [facility] [address], [address] [place], [place] [phone]

## 2019-06-20 NOTE — Patient Instructions (Signed)
Please continue using your CPAP regularly. While your insurance requires that you use CPAP at least 4 hours each night on 70% of the nights, I recommend, that you not skip any nights and use it throughout the night if you can. Getting used to CPAP and staying with the treatment long term does take time and patience and discipline. Untreated obstructive sleep apnea when it is moderate to severe can have an adverse impact on cardiovascular health and raise her risk for heart disease, arrhythmias, hypertension, congestive heart failure, stroke and diabetes. Untreated obstructive sleep apnea causes sleep disruption, nonrestorative sleep, and sleep deprivation. This can have an impact on your day to day functioning and cause daytime sleepiness and impairment of cognitive function, memory loss, mood disturbance, and problems focussing. Using CPAP regularly can improve these symptoms.   Follow up in 1 year   Sleep Apnea Sleep apnea affects breathing during sleep. It causes breathing to stop for a short time or to become shallow. It can also increase the risk of:  Heart attack.  Stroke.  Being very overweight (obese).  Diabetes.  Heart failure.  Irregular heartbeat. The goal of treatment is to help you breathe normally again. What are the causes? There are three kinds of sleep apnea:  Obstructive sleep apnea. This is caused by a blocked or collapsed airway.  Central sleep apnea. This happens when the brain does not send the right signals to the muscles that control breathing.  Mixed sleep apnea. This is a combination of obstructive and central sleep apnea. The most common cause of this condition is a collapsed or blocked airway. This can happen if:  Your throat muscles are too relaxed.  Your tongue and tonsils are too large.  You are overweight.  Your airway is too small. What increases the risk?  Being overweight.  Smoking.  Having a small airway.  Being older.  Being  male.  Drinking alcohol.  Taking medicines to calm yourself (sedatives or tranquilizers).  Having family members with the condition. What are the signs or symptoms?  Trouble staying asleep.  Being sleepy or tired during the day.  Getting angry a lot.  Loud snoring.  Headaches in the morning.  Not being able to focus your mind (concentrate).  Forgetting things.  Less interest in sex.  Mood swings.  Personality changes.  Feelings of sadness (depression).  Waking up a lot during the night to pee (urinate).  Dry mouth.  Sore throat. How is this diagnosed?  Your medical history.  A physical exam.  A test that is done when you are sleeping (sleep study). The test is most often done in a sleep lab but may also be done at home. How is this treated?   Sleeping on your side.  Using a medicine to get rid of mucus in your nose (decongestant).  Avoiding the use of alcohol, medicines to help you relax, or certain pain medicines (narcotics).  Losing weight, if needed.  Changing your diet.  Not smoking.  Using a machine to open your airway while you sleep, such as: ? An oral appliance. This is a mouthpiece that shifts your lower jaw forward. ? A CPAP device. This device blows air through a mask when you breathe out (exhale). ? An EPAP device. This has valves that you put in each nostril. ? A BPAP device. This device blows air through a mask when you breathe in (inhale) and breathe out.  Having surgery if other treatments do not work. It is   important to get treatment for sleep apnea. Without treatment, it can lead to:  High blood pressure.  Coronary artery disease.  In men, not being able to have an erection (impotence).  Reduced thinking ability. Follow these instructions at home: Lifestyle  Make changes that your doctor recommends.  Eat a healthy diet.  Lose weight if needed.  Avoid alcohol, medicines to help you relax, and some pain  medicines.  Do not use any products that contain nicotine or tobacco, such as cigarettes, e-cigarettes, and chewing tobacco. If you need help quitting, ask your doctor. General instructions  Take over-the-counter and prescription medicines only as told by your doctor.  If you were given a machine to use while you sleep, use it only as told by your doctor.  If you are having surgery, make sure to tell your doctor you have sleep apnea. You may need to bring your device with you.  Keep all follow-up visits as told by your doctor. This is important. Contact a doctor if:  The machine that you were given to use during sleep bothers you or does not seem to be working.  You do not get better.  You get worse. Get help right away if:  Your chest hurts.  You have trouble breathing in enough air.  You have an uncomfortable feeling in your back, arms, or stomach.  You have trouble talking.  One side of your body feels weak.  A part of your face is hanging down. These symptoms may be an emergency. Do not wait to see if the symptoms will go away. Get medical help right away. Call your local emergency services (911 in the [place] Do not drive yourself to the hospital. Summary  This condition affects breathing during sleep.  The most common cause is a collapsed or blocked airway.  The goal of treatment is to help you breathe normally while you sleep. This information is not intended to replace advice given to you by your health care provider. Make sure you discuss any questions you have with your health care provider. Document Revised: 10/22/2017 Document Reviewed: 08/31/2017 Elsevier Patient Education  2020 [employer]  

## 2019-06-21 ENCOUNTER — Ambulatory Visit: Payer: BC Managed Care – PPO | Admitting: Family Medicine

## 2019-06-21 ENCOUNTER — Other Ambulatory Visit: Payer: Self-pay

## 2019-06-21 ENCOUNTER — Encounter: Payer: Self-pay | Admitting: Family Medicine

## 2019-06-21 VITALS — BP 142/91 | HR 74 | Ht 65.0 in | Wt 213.0 lb

## 2019-06-21 DIAGNOSIS — G4733 Obstructive sleep apnea (adult) (pediatric): Secondary | ICD-10-CM

## 2019-06-21 DIAGNOSIS — Z9989 Dependence on other enabling machines and devices: Secondary | ICD-10-CM | POA: Diagnosis not present

## 2019-06-21 NOTE — Progress Notes (Signed)
Order for cpap supplies sent to [facility] via community msg. Confirmation received that the order transmitted was successful.  

## 2019-06-27 DIAGNOSIS — H903 Sensorineural hearing loss, bilateral: Secondary | ICD-10-CM | POA: Diagnosis not present

## 2019-06-27 DIAGNOSIS — R42 Dizziness and giddiness: Secondary | ICD-10-CM | POA: Diagnosis not present

## 2019-10-24 DIAGNOSIS — Z13220 Encounter for screening for lipoid disorders: Secondary | ICD-10-CM | POA: Diagnosis not present

## 2019-10-24 DIAGNOSIS — Z0000 Encounter for general adult medical examination without abnormal findings: Secondary | ICD-10-CM | POA: Diagnosis not present

## 2019-10-24 DIAGNOSIS — Z125 Encounter for screening for malignant neoplasm of prostate: Secondary | ICD-10-CM | POA: Diagnosis not present

## 2019-10-31 DIAGNOSIS — Z0000 Encounter for general adult medical examination without abnormal findings: Secondary | ICD-10-CM | POA: Diagnosis not present

## 2019-10-31 DIAGNOSIS — Z23 Encounter for immunization: Secondary | ICD-10-CM | POA: Diagnosis not present

## 2019-10-31 DIAGNOSIS — R030 Elevated blood-pressure reading, without diagnosis of hypertension: Secondary | ICD-10-CM | POA: Diagnosis not present

## 2019-12-20 DIAGNOSIS — Z6835 Body mass index (BMI) 35.0-35.9, adult: Secondary | ICD-10-CM | POA: Diagnosis not present

## 2019-12-20 DIAGNOSIS — R7303 Prediabetes: Secondary | ICD-10-CM | POA: Diagnosis not present

## 2019-12-20 DIAGNOSIS — R030 Elevated blood-pressure reading, without diagnosis of hypertension: Secondary | ICD-10-CM | POA: Diagnosis not present

## 2019-12-21 DIAGNOSIS — E669 Obesity, unspecified: Secondary | ICD-10-CM | POA: Diagnosis not present

## 2019-12-21 DIAGNOSIS — E785 Hyperlipidemia, unspecified: Secondary | ICD-10-CM | POA: Diagnosis not present

## 2019-12-21 DIAGNOSIS — Z125 Encounter for screening for malignant neoplasm of prostate: Secondary | ICD-10-CM | POA: Diagnosis not present

## 2019-12-21 DIAGNOSIS — K22719 Barrett's esophagus with dysplasia, unspecified: Secondary | ICD-10-CM | POA: Diagnosis not present

## 2019-12-21 DIAGNOSIS — K219 Gastro-esophageal reflux disease without esophagitis: Secondary | ICD-10-CM | POA: Diagnosis not present

## 2019-12-21 DIAGNOSIS — Z79899 Other long term (current) drug therapy: Secondary | ICD-10-CM | POA: Diagnosis not present

## 2019-12-27 DIAGNOSIS — K219 Gastro-esophageal reflux disease without esophagitis: Secondary | ICD-10-CM | POA: Diagnosis not present

## 2019-12-27 DIAGNOSIS — Z6836 Body mass index (BMI) 36.0-36.9, adult: Secondary | ICD-10-CM | POA: Diagnosis not present

## 2019-12-27 DIAGNOSIS — M545 Low back pain, unspecified: Secondary | ICD-10-CM | POA: Diagnosis not present

## 2019-12-27 DIAGNOSIS — E669 Obesity, unspecified: Secondary | ICD-10-CM | POA: Diagnosis not present

## 2020-04-11 DIAGNOSIS — R7303 Prediabetes: Secondary | ICD-10-CM | POA: Diagnosis not present

## 2020-04-11 DIAGNOSIS — R030 Elevated blood-pressure reading, without diagnosis of hypertension: Secondary | ICD-10-CM | POA: Diagnosis not present

## 2020-04-11 DIAGNOSIS — Z6832 Body mass index (BMI) 32.0-32.9, adult: Secondary | ICD-10-CM | POA: Diagnosis not present

## 2020-04-11 DIAGNOSIS — E6609 Other obesity due to excess calories: Secondary | ICD-10-CM | POA: Diagnosis not present

## 2020-05-14 DIAGNOSIS — J019 Acute sinusitis, unspecified: Secondary | ICD-10-CM | POA: Diagnosis not present

## 2020-06-25 NOTE — Patient Instructions (Signed)
Please continue using your CPAP regularly. While your insurance requires that you use CPAP at least 4 hours each night on 70% of the nights, I recommend, that you not skip any nights and use it throughout the night if you can. Getting used to CPAP and staying with the treatment long term does take time and patience and discipline. Untreated obstructive sleep apnea when it is moderate to severe can have an adverse impact on cardiovascular health and raise her risk for heart disease, arrhythmias, hypertension, congestive heart failure, stroke and diabetes. Untreated obstructive sleep apnea causes sleep disruption, nonrestorative sleep, and sleep deprivation. This can have an impact on your day to day functioning and cause daytime sleepiness and impairment of cognitive function, memory loss, mood disturbance, and problems focussing. Using CPAP regularly can improve these symptoms.   Follow up in 1 year   Sleep Apnea Sleep apnea affects breathing during sleep. It causes breathing to stop for a short time or to become shallow. It can also increase the risk of:  Heart attack.  Stroke.  Being very overweight (obese).  Diabetes.  Heart failure.  Irregular heartbeat. The goal of treatment is to help you breathe normally again. What are the causes? There are three kinds of sleep apnea:  Obstructive sleep apnea. This is caused by a blocked or collapsed airway.  Central sleep apnea. This happens when the brain does not send the right signals to the muscles that control breathing.  Mixed sleep apnea. This is a combination of obstructive and central sleep apnea. The most common cause of this condition is a collapsed or blocked airway. This can happen if:  Your throat muscles are too relaxed.  Your tongue and tonsils are too large.  You are overweight.  Your airway is too small.   What increases the risk?  Being overweight.  Smoking.  Having a small airway.  Being older.  Being  male.  Drinking alcohol.  Taking medicines to calm yourself (sedatives or tranquilizers).  Having family members with the condition. What are the signs or symptoms?  Trouble staying asleep.  Being sleepy or tired during the day.  Getting angry a lot.  Loud snoring.  Headaches in the morning.  Not being able to focus your mind (concentrate).  Forgetting things.  Less interest in sex.  Mood swings.  Personality changes.  Feelings of sadness (depression).  Waking up a lot during the night to pee (urinate).  Dry mouth.  Sore throat. How is this diagnosed?  Your medical history.  A physical exam.  A test that is done when you are sleeping (sleep study). The test is most often done in a sleep lab but may also be done at home. How is this treated?  Sleeping on your side.  Using a medicine to get rid of mucus in your nose (decongestant).  Avoiding the use of alcohol, medicines to help you relax, or certain pain medicines (narcotics).  Losing weight, if needed.  Changing your diet.  Not smoking.  Using a machine to open your airway while you sleep, such as: ? An oral appliance. This is a mouthpiece that shifts your lower jaw forward. ? A CPAP device. This device blows air through a mask when you breathe out (exhale). ? An EPAP device. This has valves that you put in each nostril. ? A BPAP device. This device blows air through a mask when you breathe in (inhale) and breathe out.  Having surgery if other treatments do not work. It   is important to get treatment for sleep apnea. Without treatment, it can lead to:  High blood pressure.  Coronary artery disease.  In men, not being able to have an erection (impotence).  Reduced thinking ability.   Follow these instructions at home: Lifestyle  Make changes that your doctor recommends.  Eat a healthy diet.  Lose weight if needed.  Avoid alcohol, medicines to help you relax, and some pain  medicines.  Do not use any products that contain nicotine or tobacco, such as cigarettes, e-cigarettes, and chewing tobacco. If you need help quitting, ask your doctor. General instructions  Take over-the-counter and prescription medicines only as told by your doctor.  If you were given a machine to use while you sleep, use it only as told by your doctor.  If you are having surgery, make sure to tell your doctor you have sleep apnea. You may need to bring your device with you.  Keep all follow-up visits as told by your doctor. This is important. Contact a doctor if:  The machine that you were given to use during sleep bothers you or does not seem to be working.  You do not get better.  You get worse. Get help right away if:  Your chest hurts.  You have trouble breathing in enough air.  You have an uncomfortable feeling in your back, arms, or stomach.  You have trouble talking.  One side of your body feels weak.  A part of your face is hanging down. These symptoms may be an emergency. Do not wait to see if the symptoms will go away. Get medical help right away. Call your local emergency services (911 in the [place] Do not drive yourself to the hospital. Summary  This condition affects breathing during sleep.  The most common cause is a collapsed or blocked airway.  The goal of treatment is to help you breathe normally while you sleep. This information is not intended to replace advice given to you by your health care provider. Make sure you discuss any questions you have with your health care provider. Document Revised: 10/22/2017 Document Reviewed: 08/31/2017 Elsevier Patient Education  2021 Elsevier Inc.  

## 2020-06-25 NOTE — Progress Notes (Signed)
PATIENT: Cole Simpson DOB: 05/14/1965  REASON FOR VISIT: follow up HISTORY FROM: patient  Chief Complaint  Patient presents with  . Follow-up    RM 1, alone. Here for CPAP f/u, pt reports doing well w/ CPAP therapy.      HISTORY OF PRESENT ILLNESS: 06/26/2020 ALL:  [name] returns for follow up for OSA on CPAP.      06/21/2019 ALL:  Cole Simpson is a 55 y.o. male here today for follow up for OSA on CPAP.  He is doing very well with CPAP at home.  He is using CPAP nightly.  He does note significant in sleep quality.  He denies any concerns today.  Compliance report dated 12/20/2019 through 06/18/2019 reveals that he used CPAP thirty of the past 30 days for compliance of 100%.  The CPAP greater than 4 hours 100% of the time.  Average usage was 7 hours and 53 minutes.  Residual AHI was 0.5 on 5 to 15 cm of water and an EPR of three.  There is no significant leak noted.  HISTORY: (copied from my note on 06/21/2018)  Cole Simpson is a 54 y.o. male for follow up of OSA on CPAP.  He states that he is doing well with CPAP therapy.  He does use his machine nearly every night.  He admits that occasionally when he goes to the beach he does not take his CPAP therapy.  He is in need of supplies today.  05/21/2018 - 06/19/2018 020 Usage days 27/30 days (90%) >= 4 hours 27 days (90%) < 4 hours 0 days (0%) Usage hours 222 hours 5 minutes Average usage (total days) 7 hours 24 minutes Average usage (days used) 8 hours 14 minutes Median usage (days used) 8 hours 9 minutes Total used hours (value since last reset - 06/19/2018) 3,740 hours AirSense 10 AutoSet Serial number [id number] Mode AutoSet Min Pressure 5 cmH2O Max Pressure 15 cmH2O EPR Fulltime EPR level 3 Response Standard Therapy Pressure - cmH2O Median: 6.7 95th percentile: 9.6 Maximum: 10.9 Leaks - L/min Median: 0.0 95th percentile: 1.3 Maximum: 5.9 Events per hour AI: 0.6 HI: 0.1 AHI: 0.7 Apnea Index Central: 0.0  Obstructive: 0.6 Unknown: 0.0 RERA Index 0.2 Cheyne-Stokes respiration (average duration per night) 0 minutes (0%) Usage - hours Printed on  HISTORY (copied from [employer] note on 04/15/2017)  UPDATE3/28/2019CMMr. Mella, 55 year old male returns for follow-up for initial CPAP compliance after recently being diagnosed with obstructive sleep apnea. He is having no problems with adjustment. He has nasal pillows. Compliance data dated 03/16/2017-04/14/2017 shows compliance greater than 4 hours for 30 days at 100%. Average usage 7 hours 37 minutes. Set pressure 5-15 cm. EPR level 3 AHI 0.8. ESS 2. He returns for reevaluation 12/15/17 CDRobert J Simpson a 55 y.o.male, seen here in a referral from Dr. Saunders Glance a sleep consultation.  I had the pleasure of meeting Cole Simpson, a 55 year old Caucasian, married, left-handed male patient of Dr. Pearson Grippe on 15 December 2016. The patient is married to a nurse and reports that he has also occurred that his brothers have noted his snoring. He has 4 brothers of which 3 are on CPAP. He has seasonal allergies culminating and allergic rhinitis, enlarged adenoids.Aside from snoring and allergies he has suffered from GERD.  Chief complaint according to patient :" my wife and children are bothered by my snoring and I feel more tired in the morning"  Sleep habits are as follows: The patient's usual  bedtime is between 10 and 11 PM, his bedroom is cool, quiet and dark and he shares it with his wife. He sleeps on 2 pillows usually on his sides,his wife has noticed that snoring is louder when he rests on his right side.  He has once a night a bathroom break. Otherwise he will sleeps through until the morning hours when he spontaneously awakens around 5:50 AM,he also has an alarm set. He cannot recall vivid dreams, at least no lately.  He is just not as ready to start the day.He awakens with a dry mouth, but neither headaches,  no nausea no dizziness were endorsed. He does not have palpitations, he is not sweaty or clammy and he does not have chest pain.  REVIEW OF SYSTEMS: Out of a complete 14 system review of symptoms, the patient complains only of the following symptoms, none and all other reviewed systems are negative.  ESS: 1 FSS: 25  ALLERGIES: No Known Allergies  HOME MEDICATIONS: Outpatient Medications Prior to Visit  Medication Sig Dispense Refill  . Calcium Carb-Cholecalciferol 600-200 MG-UNIT TABS Take 1 tablet by mouth daily.    . fluticasone (FLONASE) 50 MCG/ACT nasal spray Place into both nostrils daily.    Marland Kitchen GLUCOSAMINE-CHONDROIT-MSM-C-MN PO Take 1 tablet by mouth daily.    Boris Lown Oil 300 MG CAPS Take by mouth.    . loratadine (CLARITIN) 10 MG tablet Take 10 mg by mouth daily.    . Multiple Vitamin (MULTIVITAMIN) tablet Take 1 tablet by mouth daily.    Marland Kitchen omeprazole (PRILOSEC) 20 MG capsule Take 20 mg by mouth 2 (two) times daily before a meal.     . SILDENAFIL CITRATE PO Take 20 mg by mouth as needed.    . vitamin C (ASCORBIC ACID) 500 MG tablet Take 500 mg by mouth daily.    . WEGOVY 2.4 MG/0.75ML SOAJ Inject 2.4 mg into the skin once a week.     No facility-administered medications prior to visit.    PAST MEDICAL HISTORY: Past Medical History:  Diagnosis Date  . GERD (gastroesophageal reflux disease)   . OSA on CPAP 2018    PAST SURGICAL HISTORY: History reviewed. No pertinent surgical history.  FAMILY HISTORY: Family History  Problem Relation Age of Onset  . Brain cancer Mother   . Heart failure Father     SOCIAL HISTORY: Social History   Socioeconomic History  . Marital status: Married    Spouse name: Not on file  . Number of children: Not on file  . Years of education: Not on file  . Highest education level: Not on file  Occupational History  . Not on file  Tobacco Use  . Smoking status: Former Games developer  . Smokeless tobacco: Never Used  . Tobacco comment: quit 20  years ago  Substance and Sexual Activity  . Alcohol use: Yes    Comment: occasionally  . Drug use: No  . Sexual activity: Not on file  Other Topics Concern  . Not on file  Social History Narrative  . Not on file   Social Determinants of Health   Financial Resource Strain: Not on file  Food Insecurity: Not on file  Transportation Needs: Not on file  Physical Activity: Not on file  Stress: Not on file  Social Connections: Not on file  Intimate Partner Violence: Not on file      PHYSICAL EXAM  Vitals:   06/26/20 0732  BP: 116/82  Pulse: 76  Weight: 192 lb (87.1 kg)  Height: 5\' 5"  (1.651 m)   Body mass index is 31.95 kg/m.  Generalized: Well developed, in no acute distress  Cardiology: normal rate and rhythm, no murmur noted Respiratory: clear to auscultation bilaterally  Neurological examination  Mentation: Alert oriented to time, place, history taking. Follows all commands speech and language fluent Cranial nerve II-XII: Pupils were equal round reactive to light. Extraocular movements were full, visual field were full  Motor: The motor testing reveals 5 over 5 strength of all 4 extremities. Good symmetric motor tone is noted throughout.  Gait and station: Gait is normal.   DIAGNOSTIC DATA (LABS, IMAGING, TESTING) - I reviewed patient records, labs, notes, testing and imaging myself where available.  No flowsheet data found.   No results found for: WBC, HGB, HCT, MCV, PLT No results found for: NA, K, CL, CO2, GLUCOSE, BUN, CREATININE, CALCIUM, PROT, ALBUMIN, AST, ALT, ALKPHOS, BILITOT, GFRNONAA, GFRAA No results found for: CHOL, HDL, LDLCALC, LDLDIRECT, TRIG, CHOLHDL No results found for: No results found for: VITAMINB12 No results found for: TSH     ASSESSMENT AND PLAN 55 y.o. year old male  has a past medical history of GERD (gastroesophageal reflux disease) and OSA on CPAP (2018). here with     ICD-10-CM   1. Obstructive sleep apnea treated with  continuous positive airway pressure (CPAP)  G47.33 For home use only DME continuous positive airway pressure (CPAP)   Z99.89      He continues to do very well with CPAP therapy. Compliance report reveals excellent compliance. He was encouraged to continue CPAP nightly and greater than 4 hours each night. Supply orders placed today. Healthy lifestyle habits encouraged. He will follow up in 1 year, sooner if needed. He verbalizes understanding and agreement with this plan.    Orders Placed This Encounter  Procedures  . For home use only DME continuous positive airway pressure (CPAP)    Supplies    Order Specific Question:   Length of Need    Answer:   Lifetime    Order Specific Question:   Patient has OSA or probable OSA    Answer:   Yes    Order Specific Question:   Is the patient currently using CPAP in the home    Answer:   Yes    Order Specific Question:   Settings    Answer:   Other see comments    Order Specific Question:   CPAP supplies needed    Answer:   Mask, headgear, cushions, filters, heated tubing and water chamber     No orders of the defined types were placed in this encounter.    02-16-1978, FNP-C 06/26/2020, 8:16 AM [facility] [address], [address] [place], [place] [phone]

## 2020-06-26 ENCOUNTER — Encounter: Payer: Self-pay | Admitting: Family Medicine

## 2020-06-26 ENCOUNTER — Ambulatory Visit: Payer: BC Managed Care – PPO | Admitting: Family Medicine

## 2020-06-26 VITALS — BP 116/82 | HR 76 | Ht 65.0 in | Wt 192.0 lb

## 2020-06-26 DIAGNOSIS — G4733 Obstructive sleep apnea (adult) (pediatric): Secondary | ICD-10-CM | POA: Diagnosis not present

## 2020-06-26 DIAGNOSIS — Z9989 Dependence on other enabling machines and devices: Secondary | ICD-10-CM

## 2020-08-14 DIAGNOSIS — K2270 Barrett's esophagus without dysplasia: Secondary | ICD-10-CM | POA: Diagnosis not present

## 2020-08-14 DIAGNOSIS — E6609 Other obesity due to excess calories: Secondary | ICD-10-CM | POA: Diagnosis not present

## 2020-08-14 DIAGNOSIS — K219 Gastro-esophageal reflux disease without esophagitis: Secondary | ICD-10-CM | POA: Diagnosis not present

## 2020-09-26 DIAGNOSIS — K219 Gastro-esophageal reflux disease without esophagitis: Secondary | ICD-10-CM | POA: Diagnosis not present

## 2020-09-26 DIAGNOSIS — K2970 Gastritis, unspecified, without bleeding: Secondary | ICD-10-CM | POA: Diagnosis not present

## 2020-09-26 DIAGNOSIS — K209 Esophagitis, unspecified without bleeding: Secondary | ICD-10-CM | POA: Diagnosis not present

## 2020-09-26 DIAGNOSIS — K449 Diaphragmatic hernia without obstruction or gangrene: Secondary | ICD-10-CM | POA: Diagnosis not present

## 2020-10-17 DIAGNOSIS — E6609 Other obesity due to excess calories: Secondary | ICD-10-CM | POA: Diagnosis not present

## 2020-10-17 DIAGNOSIS — R030 Elevated blood-pressure reading, without diagnosis of hypertension: Secondary | ICD-10-CM | POA: Diagnosis not present

## 2020-10-17 DIAGNOSIS — Z0000 Encounter for general adult medical examination without abnormal findings: Secondary | ICD-10-CM | POA: Diagnosis not present

## 2020-10-17 DIAGNOSIS — Z125 Encounter for screening for malignant neoplasm of prostate: Secondary | ICD-10-CM | POA: Diagnosis not present

## 2020-10-17 DIAGNOSIS — Z6829 Body mass index (BMI) 29.0-29.9, adult: Secondary | ICD-10-CM | POA: Diagnosis not present

## 2020-10-17 DIAGNOSIS — R7303 Prediabetes: Secondary | ICD-10-CM | POA: Diagnosis not present

## 2020-11-01 DIAGNOSIS — J019 Acute sinusitis, unspecified: Secondary | ICD-10-CM | POA: Diagnosis not present

## 2020-11-24 DIAGNOSIS — J019 Acute sinusitis, unspecified: Secondary | ICD-10-CM | POA: Diagnosis not present

## 2021-01-08 DIAGNOSIS — E669 Obesity, unspecified: Secondary | ICD-10-CM | POA: Diagnosis not present

## 2021-01-08 DIAGNOSIS — Z0189 Encounter for other specified special examinations: Secondary | ICD-10-CM | POA: Diagnosis not present

## 2021-01-27 ENCOUNTER — Encounter: Payer: Self-pay | Admitting: Family Medicine

## 2021-01-28 DIAGNOSIS — Z0000 Encounter for general adult medical examination without abnormal findings: Secondary | ICD-10-CM | POA: Diagnosis not present

## 2021-02-07 DIAGNOSIS — Z0000 Encounter for general adult medical examination without abnormal findings: Secondary | ICD-10-CM | POA: Diagnosis not present

## 2021-02-10 ENCOUNTER — Other Ambulatory Visit: Payer: Self-pay | Admitting: Internal Medicine

## 2021-02-10 DIAGNOSIS — G4733 Obstructive sleep apnea (adult) (pediatric): Secondary | ICD-10-CM

## 2021-02-13 DIAGNOSIS — G4733 Obstructive sleep apnea (adult) (pediatric): Secondary | ICD-10-CM | POA: Diagnosis not present

## 2021-03-04 ENCOUNTER — Ambulatory Visit
Admission: RE | Admit: 2021-03-04 | Discharge: 2021-03-04 | Disposition: A | Discharge status: Home / Self Care | Payer: Self-pay | Source: Ambulatory Visit | Attending: Internal Medicine | Admitting: Internal Medicine

## 2021-03-04 DIAGNOSIS — G4733 Obstructive sleep apnea (adult) (pediatric): Secondary | ICD-10-CM

## 2021-03-16 DIAGNOSIS — G4733 Obstructive sleep apnea (adult) (pediatric): Secondary | ICD-10-CM | POA: Diagnosis not present

## 2021-04-10 DIAGNOSIS — I2510 Atherosclerotic heart disease of native coronary artery without angina pectoris: Secondary | ICD-10-CM | POA: Diagnosis not present

## 2021-04-13 DIAGNOSIS — G4733 Obstructive sleep apnea (adult) (pediatric): Secondary | ICD-10-CM | POA: Diagnosis not present

## 2021-04-15 DIAGNOSIS — I2510 Atherosclerotic heart disease of native coronary artery without angina pectoris: Secondary | ICD-10-CM | POA: Diagnosis not present

## 2021-04-15 DIAGNOSIS — Z6829 Body mass index (BMI) 29.0-29.9, adult: Secondary | ICD-10-CM | POA: Diagnosis not present

## 2021-04-15 DIAGNOSIS — R7303 Prediabetes: Secondary | ICD-10-CM | POA: Diagnosis not present

## 2021-04-15 DIAGNOSIS — E6609 Other obesity due to excess calories: Secondary | ICD-10-CM | POA: Diagnosis not present

## 2021-04-28 DIAGNOSIS — K219 Gastro-esophageal reflux disease without esophagitis: Secondary | ICD-10-CM | POA: Diagnosis not present

## 2021-04-28 DIAGNOSIS — K2270 Barrett's esophagus without dysplasia: Secondary | ICD-10-CM | POA: Diagnosis not present

## 2021-04-28 DIAGNOSIS — R079 Chest pain, unspecified: Secondary | ICD-10-CM | POA: Diagnosis not present

## 2021-05-20 DIAGNOSIS — I2510 Atherosclerotic heart disease of native coronary artery without angina pectoris: Secondary | ICD-10-CM | POA: Diagnosis not present

## 2021-06-20 ENCOUNTER — Encounter: Payer: Self-pay | Admitting: Family Medicine

## 2021-06-26 ENCOUNTER — Ambulatory Visit: Payer: BC Managed Care – PPO | Admitting: Family Medicine

## 2021-09-11 ENCOUNTER — Ambulatory Visit: Payer: BC Managed Care – PPO | Admitting: Family Medicine

## 2021-09-11 ENCOUNTER — Encounter: Payer: Self-pay | Admitting: Family Medicine

## 2021-09-11 VITALS — BP 126/73 | HR 57 | Ht 65.0 in | Wt 180.0 lb

## 2021-09-11 DIAGNOSIS — Z9989 Dependence on other enabling machines and devices: Secondary | ICD-10-CM | POA: Diagnosis not present

## 2021-09-11 DIAGNOSIS — G4733 Obstructive sleep apnea (adult) (pediatric): Secondary | ICD-10-CM

## 2021-09-11 NOTE — Progress Notes (Signed)
CM sent to [facility] for new order ?

## 2021-09-11 NOTE — Patient Instructions (Signed)

## 2021-09-11 NOTE — Progress Notes (Signed)
PATIENT: Cole Simpson DOB: [birth year]  REASON FOR VISIT: follow up HISTORY FROM: patient  No chief complaint on file.    HISTORY OF PRESENT ILLNESS:  09/11/2021 ALL: Cole Simpson returns for follow up for OSA on CPAP. He continues to do well. He is using CPAP nightly for about 7-8 hours. He has tried sleeping without CPAP and does not rest well. He has lost about 30lbs since 2019. He is taking Wegovy and tolerating well. He has 4 brothers with sleep apnea. No trouble with machine or supplies.      06/26/2020 ALL:  Cole Simpson returns for follow up for OSA on CPAP.      06/21/2019 ALL:  Cole Simpson is a 56 y.o. male here today for follow up for OSA on CPAP.  He is doing very well with CPAP at home.  He is using CPAP nightly.  He does note significant in sleep quality.  He denies any concerns today.  Compliance report dated 12/20/2019 through 06/18/2019 reveals that he used CPAP thirty of the past 30 days for compliance of 100%.  The CPAP greater than 4 hours 100% of the time.  Average usage was 7 hours and 53 minutes.  Residual AHI was 0.5 on 5 to 15 cm of water and an EPR of three.  There is no significant leak noted.  HISTORY: (copied from my note on 06/21/2018)  Cole Simpson is a 56 y.o. male for follow up of OSA on CPAP.  He states that he is doing well with CPAP therapy.  He does use his machine nearly every night.  He admits that occasionally when he goes to the beach he does not take his CPAP therapy.  He is in need of supplies today.   05/21/2018 - 06/19/2018 020 Usage days 27/30 days (90%) >= 4 hours 27 days (90%) < 4 hours 0 days (0%) Usage hours 222 hours 5 minutes Average usage (total days) 7 hours 24 minutes Average usage (days used) 8 hours 14 minutes Median usage (days used) 8 hours 9 minutes Total used hours (value since last reset - 06/19/2018) 3,740 hours AirSense 10 AutoSet Serial number [id number] Mode AutoSet Min Pressure 5 cmH2O Max Pressure 15 cmH2O EPR  Fulltime EPR level 3 Response Standard Therapy Pressure - cmH2O Median: 6.7 95th percentile: 9.6 Maximum: 10.9 Leaks - L/min Median: 0.0 95th percentile: 1.3 Maximum: 5.9 Events per hour AI: 0.6 HI: 0.1 AHI: 0.7 Apnea Index Central: 0.0 Obstructive: 0.6 Unknown: 0.0 RERA Index 0.2 Cheyne-Stokes respiration (average duration per night) 0 minutes (0%) Usage - hours Printed on   HISTORY (copied from [facility] note on 04/15/2017)   UPDATE 3/28/2019CM Cole Simpson, 56 year old male returns for follow-up for initial CPAP compliance after recently being diagnosed with obstructive sleep apnea.  He is having no problems with adjustment.  He has nasal pillows.  Compliance data dated 03/16/2017-04/14/2017 shows compliance greater than 4 hours for 30 days at 100%.  Average usage 7 hours 37 minutes.  Set pressure 5-15 cm.  EPR level 3 AHI 0.8.  ESS 2.  He returns for reevaluation 12/15/17 CDRobert J Simpson is a 56 y.o. male , seen here  in a referral  from Dr. Maudie Mercury for a sleep consultation.   I had the pleasure of meeting Cole Simpson today, a 56 year old Caucasian, married, left-handed male patient of Dr. Jani Gravel on 15 December 2016. The patient is married to a nurse and reports that he has also occurred that  his brothers have noted his snoring.  He has 4 brothers of which 3 are on CPAP. He has seasonal allergies culminating and allergic rhinitis, enlarged adenoids.  Aside from snoring and allergies he has suffered from GERD.   Chief complaint according to patient :" my wife and children are bothered by my snoring and I feel more tired in the morning"   Sleep habits are as follows:  The patient's usual bedtime is between 10 and 11 PM, his bedroom is cool, quiet and dark and he shares it with his wife.  He sleeps on 2 pillows usually on his sides, his wife has noticed that snoring is louder when he rests on his right side.   He has once a night a bathroom break.  Otherwise he will sleeps through  until the morning hours when he spontaneously awakens around 5:50 AM, he also has an alarm set. He cannot recall vivid dreams, at least no lately.  He is just not as ready to start the day.  He awakens with a dry mouth, but neither headaches, no nausea no dizziness were endorsed.  He does not have palpitations, he is not sweaty or clammy and he does not have chest pain.  REVIEW OF SYSTEMS: Out of a complete 14 system review of symptoms, the patient complains only of the following symptoms, none and all other reviewed systems are negative.  ESS: 3/24 FSS: 25  ALLERGIES: No Known Allergies  HOME MEDICATIONS: Outpatient Medications Prior to Visit  Medication Sig Dispense Refill   Calcium Carb-Cholecalciferol 600-200 MG-UNIT TABS Take 1 tablet by mouth daily.     fluticasone (FLONASE) 50 MCG/ACT nasal spray Place into both nostrils daily.     GLUCOSAMINE-CHONDROIT-MSM-C-MN PO Take 1 tablet by mouth daily.     Krill Oil 300 MG CAPS Take by mouth.     loratadine (CLARITIN) 10 MG tablet Take 10 mg by mouth daily.     Multiple Vitamin (MULTIVITAMIN) tablet Take 1 tablet by mouth daily.     omeprazole (PRILOSEC) 20 MG capsule Take 20 mg by mouth 2 (two) times daily before a meal.      rosuvastatin (CRESTOR) 5 MG tablet Take 5 mg by mouth daily.     vitamin C (ASCORBIC ACID) 500 MG tablet Take 500 mg by mouth daily.     WEGOVY 2.4 MG/0.75ML SOAJ Inject 2.4 mg into the skin once a week.     SILDENAFIL CITRATE PO Take 20 mg by mouth as needed.     No facility-administered medications prior to visit.    PAST MEDICAL HISTORY: Past Medical History:  Diagnosis Date   GERD (gastroesophageal reflux disease)    OSA on CPAP 2018    PAST SURGICAL HISTORY: History reviewed. No pertinent surgical history.  FAMILY HISTORY: Family History  Problem Relation Age of Onset   Brain cancer Mother    Heart failure Father     SOCIAL HISTORY: Social History   Socioeconomic History   Marital status:  Married    Spouse name: Not on file   Number of children: Not on file   Years of education: Not on file   Highest education level: Not on file  Occupational History   Not on file  Tobacco Use   Smoking status: Former   Smokeless tobacco: Never   Tobacco comments:    quit 20 years ago  Substance and Sexual Activity   Alcohol use: Yes    Comment: occasionally   Drug use: No  Sexual activity: Not on file  Other Topics Concern   Not on file  Social History Narrative   Not on file   Social Determinants of Health   Financial Resource Strain: Not on file  Food Insecurity: Not on file  Transportation Needs: Not on file  Physical Activity: Not on file  Stress: Not on file  Social Connections: Not on file  Intimate Partner Violence: Not on file      PHYSICAL EXAM  Vitals:   09/11/21 1254  BP: 126/73  Pulse: (!) 57  Weight: 180 lb (81.6 kg)  Height: 5\' 5"  (1.651 m)    Body mass index is 29.95 kg/m.  Generalized: Well developed, in no acute distress  Cardiology: normal rate and rhythm, no murmur noted Respiratory: clear to auscultation bilaterally  Neurological examination  Mentation: Alert oriented to time, place, history taking. Follows all commands speech and language fluent Cranial nerve II-XII: Pupils were equal round reactive to light. Extraocular movements were full, visual field were full  Motor: The motor testing reveals 5 over 5 strength of all 4 extremities. Good symmetric motor tone is noted throughout.  Gait and station: Gait is normal.   DIAGNOSTIC DATA (LABS, IMAGING, TESTING) - I reviewed patient records, labs, notes, testing and imaging myself where available.      No data to display           No results found for: "WBC", "HGB", "HCT", "MCV", "PLT" No results found for: "NA", "K", "CL", "CO2", "GLUCOSE", "BUN", "CREATININE", "CALCIUM", "PROT", "ALBUMIN", "AST", "ALT", "ALKPHOS", "BILITOT", "GFRNONAA", "GFRAA" No results found for: "CHOL",  "HDL", "LDLCALC", "LDLDIRECT", "TRIG", "CHOLHDL" No results found for: "HGBA1C" No results found for: "VITAMINB12" No results found for: "TSH"     ASSESSMENT AND PLAN 56 y.o. year old male  has a past medical history of GERD (gastroesophageal reflux disease) and OSA on CPAP (2018). here with     ICD-10-CM   1. Obstructive sleep apnea treated with continuous positive airway pressure (CPAP)  G47.33 For home use only DME continuous positive airway pressure (CPAP)   Z99.89         He continues to do very well with CPAP therapy. Compliance report reveals excellent compliance. He was encouraged to continue CPAP nightly and greater than 4 hours each night. Supply orders placed today. We have discussed replacing CPAP. He does not wish to start process at this time and will call me if he has any concerns with his machine. Healthy lifestyle habits encouraged. He will follow up in 1 year, sooner if needed. He verbalizes understanding and agreement with this plan.    Orders Placed This Encounter  Procedures   For home use only DME continuous positive airway pressure (CPAP)    Supplies    Order Specific Question:   Length of Need    Answer:   Lifetime    Order Specific Question:   Patient has OSA or probable OSA    Answer:   Yes    Order Specific Question:   Is the patient currently using CPAP in the home    Answer:   Yes    Order Specific Question:   Settings    Answer:   Other see comments    Order Specific Question:   CPAP supplies needed    Answer:   Mask, headgear, cushions, filters, heated tubing and water chamber     No orders of the defined types were placed in this encounter.    Peniel Biel, FNP-C  09/11/2021, 1:15 PM [facility] [address] [place], [place] [address] [phone]

## 2021-12-24 DIAGNOSIS — G4733 Obstructive sleep apnea (adult) (pediatric): Secondary | ICD-10-CM | POA: Diagnosis not present

## 2021-12-24 DIAGNOSIS — K2270 Barrett's esophagus without dysplasia: Secondary | ICD-10-CM | POA: Diagnosis not present

## 2021-12-24 DIAGNOSIS — Z23 Encounter for immunization: Secondary | ICD-10-CM | POA: Diagnosis not present

## 2021-12-24 DIAGNOSIS — I2510 Atherosclerotic heart disease of native coronary artery without angina pectoris: Secondary | ICD-10-CM | POA: Diagnosis not present

## 2021-12-24 DIAGNOSIS — L853 Xerosis cutis: Secondary | ICD-10-CM | POA: Diagnosis not present

## 2021-12-24 DIAGNOSIS — E669 Obesity, unspecified: Secondary | ICD-10-CM | POA: Diagnosis not present

## 2022-02-09 DIAGNOSIS — Z125 Encounter for screening for malignant neoplasm of prostate: Secondary | ICD-10-CM | POA: Diagnosis not present

## 2022-02-09 DIAGNOSIS — R7401 Elevation of levels of liver transaminase levels: Secondary | ICD-10-CM | POA: Diagnosis not present

## 2022-02-09 DIAGNOSIS — Z0000 Encounter for general adult medical examination without abnormal findings: Secondary | ICD-10-CM | POA: Diagnosis not present

## 2022-02-12 DIAGNOSIS — Z0000 Encounter for general adult medical examination without abnormal findings: Secondary | ICD-10-CM | POA: Diagnosis not present

## 2022-02-12 DIAGNOSIS — G4733 Obstructive sleep apnea (adult) (pediatric): Secondary | ICD-10-CM | POA: Diagnosis not present

## 2022-02-12 DIAGNOSIS — K219 Gastro-esophageal reflux disease without esophagitis: Secondary | ICD-10-CM | POA: Diagnosis not present

## 2022-02-12 DIAGNOSIS — K2270 Barrett's esophagus without dysplasia: Secondary | ICD-10-CM | POA: Diagnosis not present

## 2022-02-12 DIAGNOSIS — J309 Allergic rhinitis, unspecified: Secondary | ICD-10-CM | POA: Diagnosis not present

## 2022-02-17 DIAGNOSIS — R7401 Elevation of levels of liver transaminase levels: Secondary | ICD-10-CM | POA: Diagnosis not present

## 2022-02-19 DIAGNOSIS — R7401 Elevation of levels of liver transaminase levels: Secondary | ICD-10-CM | POA: Diagnosis not present

## 2022-03-23 DIAGNOSIS — K2270 Barrett's esophagus without dysplasia: Secondary | ICD-10-CM | POA: Diagnosis not present

## 2022-03-23 DIAGNOSIS — K5900 Constipation, unspecified: Secondary | ICD-10-CM | POA: Diagnosis not present

## 2022-03-23 DIAGNOSIS — K625 Hemorrhage of anus and rectum: Secondary | ICD-10-CM | POA: Diagnosis not present

## 2022-03-23 DIAGNOSIS — K219 Gastro-esophageal reflux disease without esophagitis: Secondary | ICD-10-CM | POA: Diagnosis not present

## 2022-04-16 DIAGNOSIS — E6609 Other obesity due to excess calories: Secondary | ICD-10-CM | POA: Diagnosis not present

## 2022-04-16 DIAGNOSIS — I2510 Atherosclerotic heart disease of native coronary artery without angina pectoris: Secondary | ICD-10-CM | POA: Diagnosis not present

## 2022-04-16 DIAGNOSIS — R7303 Prediabetes: Secondary | ICD-10-CM | POA: Diagnosis not present

## 2022-08-24 DIAGNOSIS — K2270 Barrett's esophagus without dysplasia: Secondary | ICD-10-CM | POA: Diagnosis not present

## 2022-09-10 NOTE — Progress Notes (Signed)
PATIENT: Cole Simpson DOB: 1965-08-12  REASON FOR VISIT: follow up HISTORY FROM: patient  Cole Complaint  Patient presents with   Follow-up    CPAP     HISTORY OF PRESENT ILLNESS:  09/14/2022 ALL: [name] returns for follow up for OSA on CPAP. He continues to do well. He is using therapy nightly for about 8 hours, on average. He denies concerns with machine or supplies. He does note significant improvement in sleep quality when using therapy.   Machine set up in 2019. He is not interested in getting a new machine at this time.     09/11/2021 ALL: Cole Simpson returns for follow up for OSA on CPAP. He continues to do well. He is using CPAP nightly for about 7-8 hours. He has tried sleeping without CPAP and does not rest well. He has lost about 30lbs since 2019. He is taking Wegovy and tolerating well. He has 4 brothers with sleep apnea. No trouble with machine or supplies.      06/26/2020 ALL:  [name] returns for follow up for OSA on CPAP.      06/21/2019 ALL:  Cole Simpson is a 57 y.o. male here today for follow up for OSA on CPAP.  He is doing very well with CPAP at home.  He is using CPAP nightly.  He does note significant in sleep quality.  He denies any concerns today.  Compliance report dated 12/20/2019 through 06/18/2019 reveals that he used CPAP thirty of the past 30 days for compliance of 100%.  The CPAP greater than 4 hours 100% of the time.  Average usage was 7 hours and 53 minutes.  Residual AHI was 0.5 on 5 to 15 cm of water and an EPR of three.  There is no significant leak noted.  HISTORY: (copied from my note on 06/21/2018)  Cole Simpson is a 57 y.o. male for follow up of OSA on CPAP.  He states that he is doing well with CPAP therapy.  He does use his machine nearly every night.  He admits that occasionally when he goes to the beach he does not take his CPAP therapy.  He is in need of supplies today.   05/21/2018 - 06/19/2018 020 Usage days 27/30 days (90%) >=  4 hours 27 days (90%) < 4 hours 0 days (0%) Usage hours 222 hours 5 minutes Average usage (total days) 7 hours 24 minutes Average usage (days used) 8 hours 14 minutes Median usage (days used) 8 hours 9 minutes Total used hours (value since last reset - 06/19/2018) 3,740 hours AirSense 10 AutoSet Serial number [id number] Mode AutoSet Min Pressure 5 cmH2O Max Pressure 15 cmH2O EPR Fulltime EPR level 3 Response Standard Therapy Pressure - cmH2O Median: 6.7 95th percentile: 9.6 Maximum: 10.9 Leaks - L/min Median: 0.0 95th percentile: 1.3 Maximum: 5.9 Events per hour AI: 0.6 HI: 0.1 AHI: 0.7 Apnea Index Central: 0.0 Obstructive: 0.6 Unknown: 0.0 RERA Index 0.2 Cheyne-Stokes respiration (average duration per night) 0 minutes (0%) Usage - hours Printed on   HISTORY (copied from [name]'s note on 04/15/2017)   UPDATE 3/28/2019CM Cole Simpson, 58 year old male returns for follow-up for initial CPAP compliance after recently being diagnosed with obstructive sleep apnea.  He is having no problems with adjustment.  He has nasal pillows.  Compliance data dated 03/16/2017-04/14/2017 shows compliance greater than 4 hours for 30 days at 100%.  Average usage 7 hours 37 minutes.  Set pressure 5-15 cm.  EPR level 3  AHI 0.8.  ESS 2.  He returns for reevaluation 12/15/17 CDRobert J Simpson is a 57 y.o. male , seen here  in a referral  from Dr. Selena Batten for a sleep consultation.   I had the pleasure of meeting Mr. Cole Simpson today, a 57 year old Caucasian, married, left-handed male patient of Dr. Pearson Grippe on 15 December 2016. The patient is married to a nurse and reports that he has also occurred that his brothers have noted his snoring.  He has 4 brothers of which 3 are on CPAP. He has seasonal allergies culminating and allergic rhinitis, enlarged adenoids.  Aside from snoring and allergies he has suffered from GERD.   Cole complaint according to patient :" my wife and children are bothered by my  snoring and I feel more tired in the morning"   Sleep habits are as follows:  The patient's usual bedtime is between 10 and 11 PM, his bedroom is cool, quiet and dark and he shares it with his wife.  He sleeps on 2 pillows usually on his sides, his wife has noticed that snoring is louder when he rests on his right side.   He has once a night a bathroom break.  Otherwise he will sleeps through until the morning hours when he spontaneously awakens around 5:50 AM, he also has an alarm set. He cannot recall vivid dreams, at least no lately.  He is just not as ready to start the day.  He awakens with a dry mouth, but neither headaches, no nausea no dizziness were endorsed.  He does not have palpitations, he is not sweaty or clammy and he does not have chest pain.  REVIEW OF SYSTEMS: Out of a complete 14 system review of symptoms, the patient complains only of the following symptoms, none and all other reviewed systems are negative.  ESS: 2/24 FSS: 25  ALLERGIES: No Known Allergies  HOME MEDICATIONS: Outpatient Medications Prior to Visit  Medication Sig Dispense Refill   Calcium Carb-Cholecalciferol 600-200 MG-UNIT TABS Take 1 tablet by mouth daily.     fluticasone (FLONASE) 50 MCG/ACT nasal spray Place into both nostrils daily.     GLUCOSAMINE-CHONDROIT-MSM-C-MN PO Take 1 tablet by mouth daily.     Krill Oil 300 MG CAPS Take by mouth.     loratadine (CLARITIN) 10 MG tablet Take 10 mg by mouth daily.     Multiple Vitamin (MULTIVITAMIN) tablet Take 1 tablet by mouth daily.     omeprazole (PRILOSEC) 20 MG capsule Take 20 mg by mouth 2 (two) times daily before a meal.      rosuvastatin (CRESTOR) 5 MG tablet Take 5 mg by mouth daily.     vitamin C (ASCORBIC ACID) 500 MG tablet Take 500 mg by mouth daily.     WEGOVY 2.4 MG/0.75ML SOAJ Inject 2.4 mg into the skin once a week.     No facility-administered medications prior to visit.    PAST MEDICAL HISTORY: Past Medical History:  Diagnosis Date    GERD (gastroesophageal reflux disease)    OSA on CPAP 2018    PAST SURGICAL HISTORY: History reviewed. No pertinent surgical history.  FAMILY HISTORY: Family History  Problem Relation Age of Onset   Brain cancer Mother    Heart failure Father     SOCIAL HISTORY: Social History   Socioeconomic History   Marital status: Married    Spouse name: Not on file   Number of children: Not on file   Years of education: Not on  file   Highest education level: Not on file  Occupational History   Not on file  Tobacco Use   Smoking status: Former   Smokeless tobacco: Never   Tobacco comments:    quit 20 years ago  Substance and Sexual Activity   Alcohol use: Yes    Comment: occasionally   Drug use: No   Sexual activity: Not on file  Other Topics Concern   Not on file  Social History Narrative   Not on file   Social Determinants of Health   Financial Resource Strain: Not on file  Food Insecurity: Not on file  Transportation Needs: Not on file  Physical Activity: Not on file  Stress: Not on file  Social Connections: Not on file  Intimate Partner Violence: Not on file      PHYSICAL EXAM  Vitals:   09/14/22 1600  BP: 135/88  Pulse: 71  Weight: 188 lb (85.3 kg)  Height: 5\' 5"  (1.651 m)     Body mass index is 31.28 kg/m.  Generalized: Well developed, in no acute distress  Cardiology: normal rate and rhythm, no murmur noted Respiratory: clear to auscultation bilaterally  Neurological examination  Mentation: Alert oriented to time, place, history taking. Follows all commands speech and language fluent Cranial nerve II-XII: Pupils were equal round reactive to light. Extraocular movements were full, visual field were full  Motor: The motor testing reveals 5 over 5 strength of all 4 extremities. Good symmetric motor tone is noted throughout.  Gait and station: Gait is normal.   DIAGNOSTIC DATA (LABS, IMAGING, TESTING) - I reviewed patient records, labs, notes,  testing and imaging myself where available.      No data to display           No results found for: "WBC", "HGB", "HCT", "MCV", "PLT" No results found for: "NA", "K", "CL", "CO2", "GLUCOSE", "BUN", "CREATININE", "CALCIUM", "PROT", "ALBUMIN", "AST", "ALT", "ALKPHOS", "BILITOT", "GFRNONAA", "GFRAA" No results found for: "CHOL", "HDL", "LDLCALC", "LDLDIRECT", "TRIG", "CHOLHDL" No results found for: "HGBA1C" No results found for: "VITAMINB12" No results found for: "TSH"     ASSESSMENT AND PLAN 57 y.o. year old male  has a past medical history of GERD (gastroesophageal reflux disease) and OSA on CPAP (2018). here with     ICD-10-CM   1. Obstructive sleep apnea treated with continuous positive airway pressure (CPAP)  G47.33 For home use only DME continuous positive airway pressure (CPAP)       He continues to do very well with CPAP therapy. Compliance report reveals excellent compliance. He was encouraged to continue CPAP nightly and greater than 4 hours each night. Supply orders placed today. We have discussed replacing CPAP. He does not wish to start process at this time and will call me if he has any concerns with his machine. Healthy lifestyle habits encouraged. He will follow up in 1 year, sooner if needed. He verbalizes understanding and agreement with this plan.    Orders Placed This Encounter  Procedures   For home use only DME continuous positive airway pressure (CPAP)    Supplies    Order Specific Question:   Length of Need    Answer:   Lifetime    Order Specific Question:   Patient has OSA or probable OSA    Answer:   Yes    Order Specific Question:   Is the patient currently using CPAP in the home    Answer:   Yes    Order Specific Question:  Settings    Answer:   Other see comments    Order Specific Question:   CPAP supplies needed    Answer:   Mask, headgear, cushions, filters, heated tubing and water chamber     No orders of the defined types were placed in  this encounter.    Christena Deem 09/14/2022, 4:38 PM [facility] [address] [place], [place] [address] [phone]

## 2022-09-10 NOTE — Patient Instructions (Signed)

## 2022-09-14 ENCOUNTER — Encounter: Payer: Self-pay | Admitting: Family Medicine

## 2022-09-14 ENCOUNTER — Ambulatory Visit: Payer: BC Managed Care – PPO | Admitting: Family Medicine

## 2022-09-14 VITALS — BP 135/88 | HR 71 | Ht 65.0 in | Wt 188.0 lb

## 2022-09-14 DIAGNOSIS — G4733 Obstructive sleep apnea (adult) (pediatric): Secondary | ICD-10-CM | POA: Diagnosis not present

## 2022-10-06 DIAGNOSIS — K449 Diaphragmatic hernia without obstruction or gangrene: Secondary | ICD-10-CM | POA: Diagnosis not present

## 2022-10-06 DIAGNOSIS — K2270 Barrett's esophagus without dysplasia: Secondary | ICD-10-CM | POA: Diagnosis not present

## 2022-10-06 DIAGNOSIS — K2289 Other specified disease of esophagus: Secondary | ICD-10-CM | POA: Diagnosis not present

## 2022-10-06 DIAGNOSIS — K317 Polyp of stomach and duodenum: Secondary | ICD-10-CM | POA: Diagnosis not present

## 2022-12-23 DIAGNOSIS — K22711 Barrett's esophagus with high grade dysplasia: Secondary | ICD-10-CM | POA: Diagnosis not present

## 2022-12-23 DIAGNOSIS — G4730 Sleep apnea, unspecified: Secondary | ICD-10-CM | POA: Diagnosis not present

## 2022-12-23 DIAGNOSIS — Z23 Encounter for immunization: Secondary | ICD-10-CM | POA: Diagnosis not present

## 2022-12-23 DIAGNOSIS — I2510 Atherosclerotic heart disease of native coronary artery without angina pectoris: Secondary | ICD-10-CM | POA: Diagnosis not present

## 2022-12-23 DIAGNOSIS — E669 Obesity, unspecified: Secondary | ICD-10-CM | POA: Diagnosis not present

## 2023-01-26 DIAGNOSIS — R7303 Prediabetes: Secondary | ICD-10-CM | POA: Diagnosis not present

## 2023-01-26 DIAGNOSIS — I2510 Atherosclerotic heart disease of native coronary artery without angina pectoris: Secondary | ICD-10-CM | POA: Diagnosis not present

## 2023-01-26 DIAGNOSIS — E6609 Other obesity due to excess calories: Secondary | ICD-10-CM | POA: Diagnosis not present

## 2023-02-03 DIAGNOSIS — Z23 Encounter for immunization: Secondary | ICD-10-CM | POA: Diagnosis not present

## 2023-02-11 DIAGNOSIS — Z0000 Encounter for general adult medical examination without abnormal findings: Secondary | ICD-10-CM | POA: Diagnosis not present

## 2023-02-11 DIAGNOSIS — R7303 Prediabetes: Secondary | ICD-10-CM | POA: Diagnosis not present

## 2023-02-11 DIAGNOSIS — Z125 Encounter for screening for malignant neoplasm of prostate: Secondary | ICD-10-CM | POA: Diagnosis not present

## 2023-02-16 DIAGNOSIS — Z0000 Encounter for general adult medical examination without abnormal findings: Secondary | ICD-10-CM | POA: Diagnosis not present

## 2023-02-16 DIAGNOSIS — R7401 Elevation of levels of liver transaminase levels: Secondary | ICD-10-CM | POA: Diagnosis not present

## 2023-02-16 DIAGNOSIS — R7303 Prediabetes: Secondary | ICD-10-CM | POA: Diagnosis not present

## 2023-02-16 DIAGNOSIS — J309 Allergic rhinitis, unspecified: Secondary | ICD-10-CM | POA: Diagnosis not present

## 2023-02-16 DIAGNOSIS — I2510 Atherosclerotic heart disease of native coronary artery without angina pectoris: Secondary | ICD-10-CM | POA: Diagnosis not present

## 2023-02-16 DIAGNOSIS — K2270 Barrett's esophagus without dysplasia: Secondary | ICD-10-CM | POA: Diagnosis not present

## 2023-02-16 DIAGNOSIS — K219 Gastro-esophageal reflux disease without esophagitis: Secondary | ICD-10-CM | POA: Diagnosis not present

## 2023-02-28 IMAGING — CT CT CARDIAC CORONARY ARTERY CALCIUM SCORE
3 series · 14 of 20 positions shown, 16 images · non-contrast
Comparison: None.

CLINICAL DATA: 55-year-old Caucasian male with family history of
heart disease and prior smoking history.

EXAM:
CT CARDIAC CORONARY ARTERY CALCIUM SCORE
TECHNIQUE: Non-contrast imaging through the heart was performed using
prospective ECG gating. Image post processing was performed on an
independent workstation, allowing for quantitative analysis of the
heart and coronary arteries. Note that this exam targets the heart
and the chest was not imaged in its entirety.

[Series 2: calcium scoring 2.00 qr36 bestdiast 70% hrt calciu · axial · 0.36mm/px · z∈[+1697,+1775]mm · 4 of 67 slices shown]
[im 14/67  vessel]
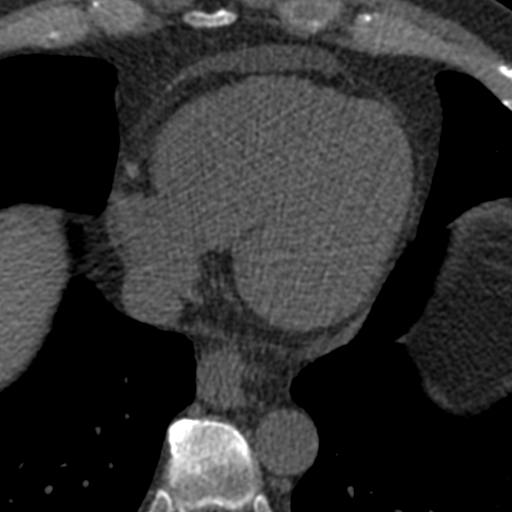
[im 27/67  vessel]
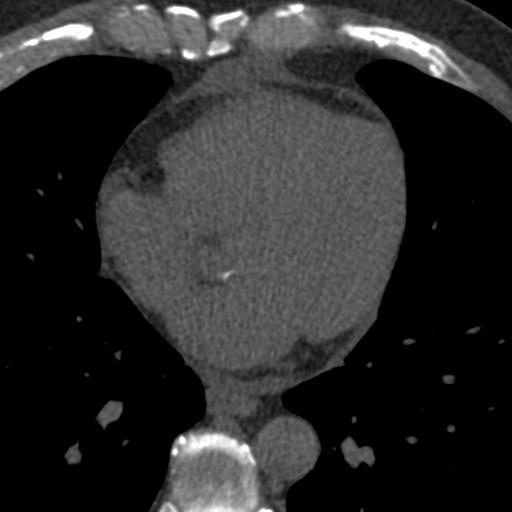
[im 40/67  vessel]
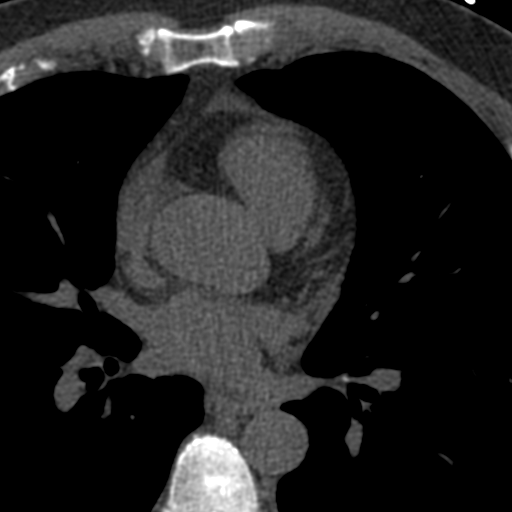
[im 53/67  vessel]
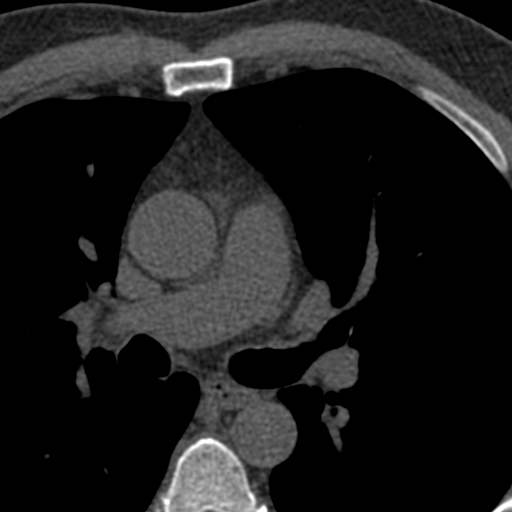

[Series 3: calcium scoring 2.00 br40 bestdiast 70% axial · axial · 0.60mm/px · z∈[+1671,+1775]mm · 5 of 80 slices shown, 7 images]
[im 14/80  vessel]
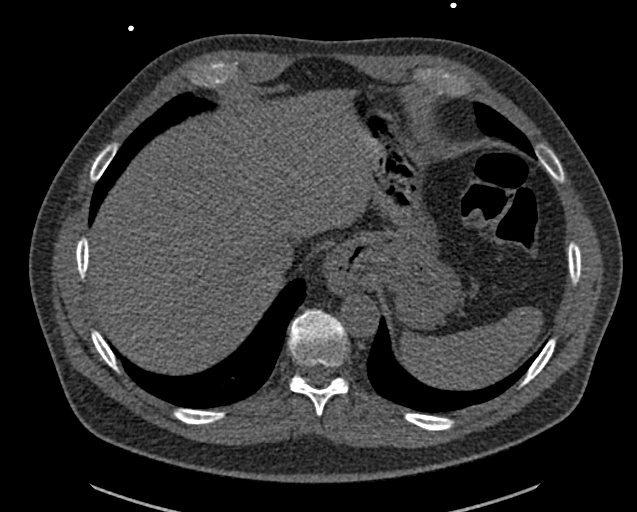
[im 14/80  lung]
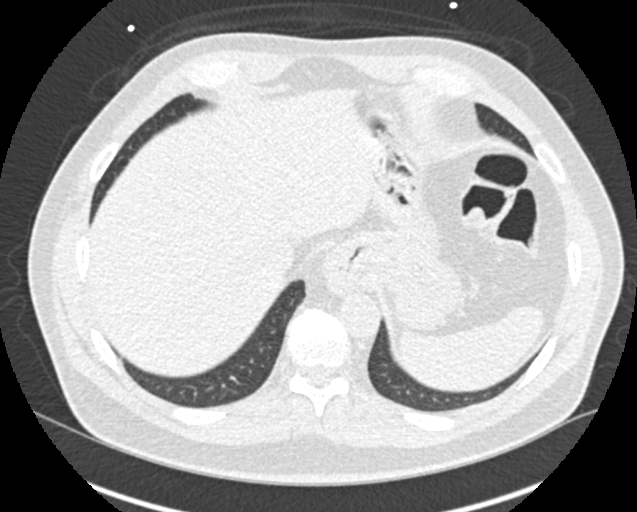
[im 27/80  vessel]
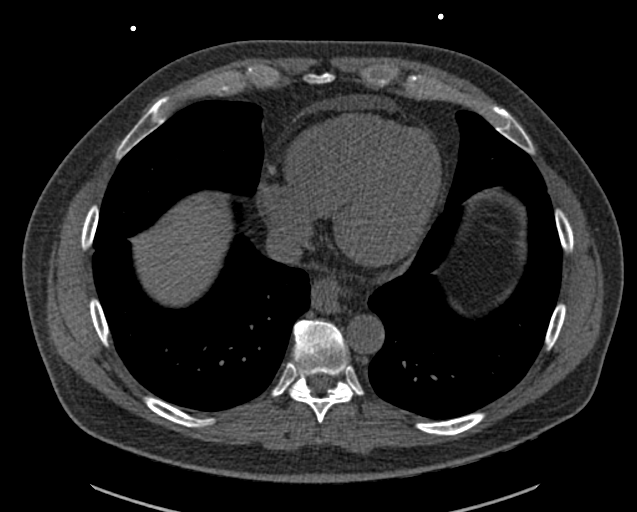
[im 40/80  vessel]
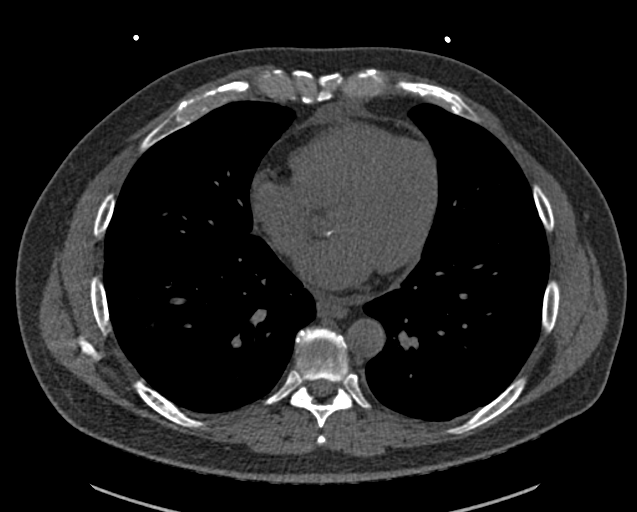
[im 53/80  vessel]
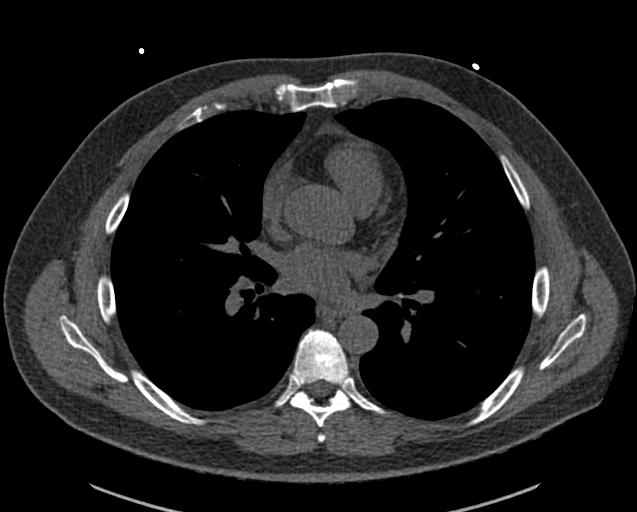
[im 66/80  vessel]
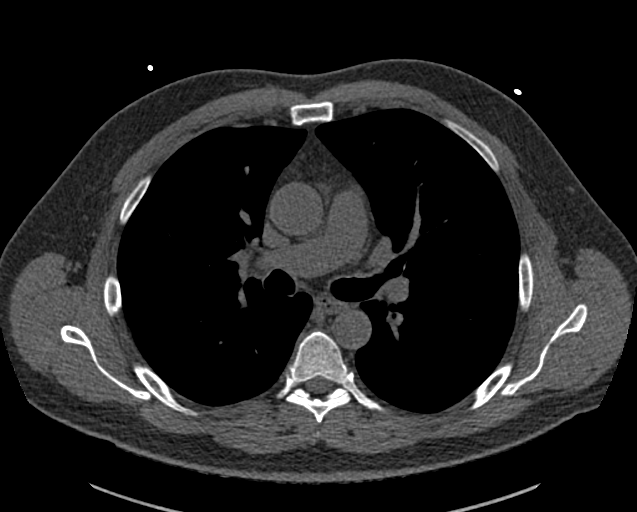
[im 66/80  lung]
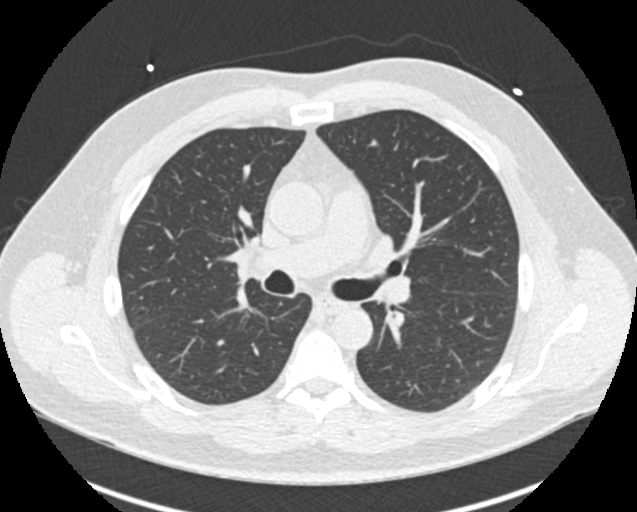

[Series 9: calcium scoring 2.00 br60 bestdiast 70% lungs · axial · 0.60mm/px · z∈[+1671,+1775]mm · 5 of 80 slices shown]
[im 14/80  vessel]
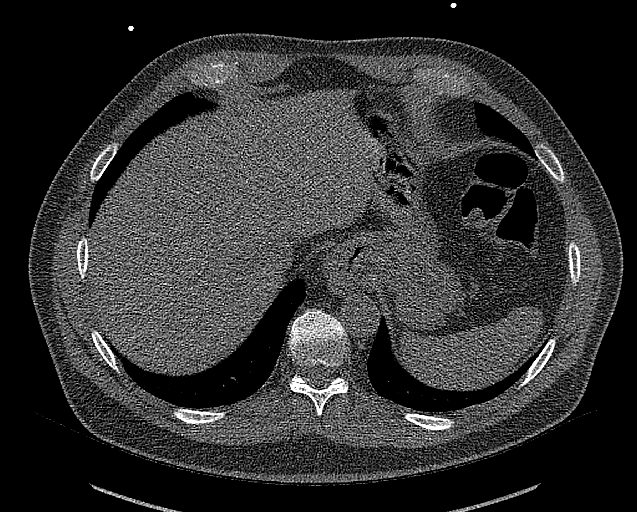
[im 27/80  vessel]
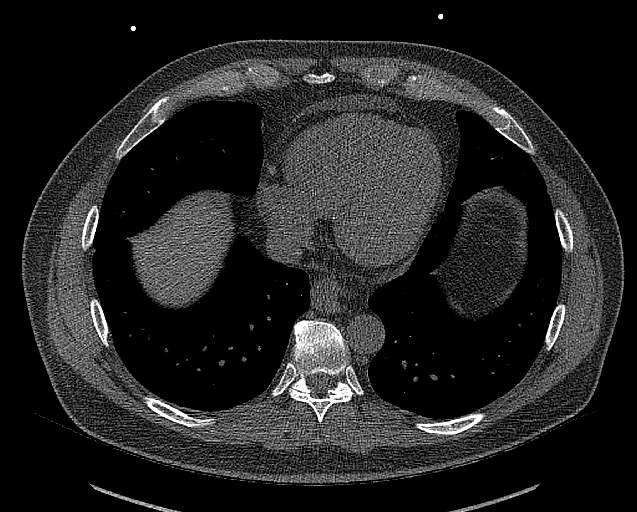
[im 40/80  vessel]
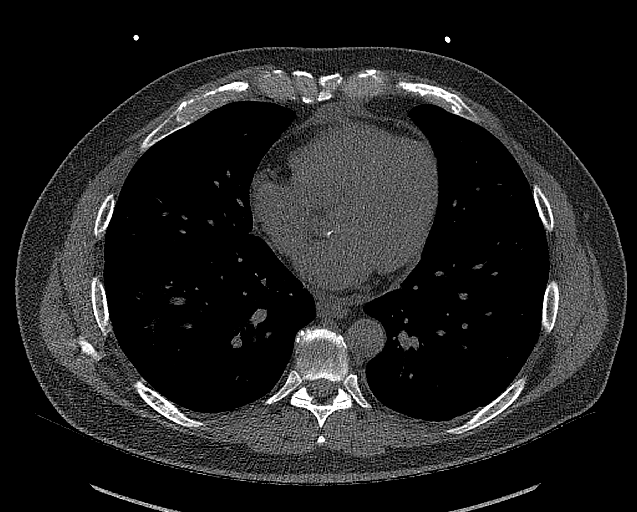
[im 53/80  vessel]
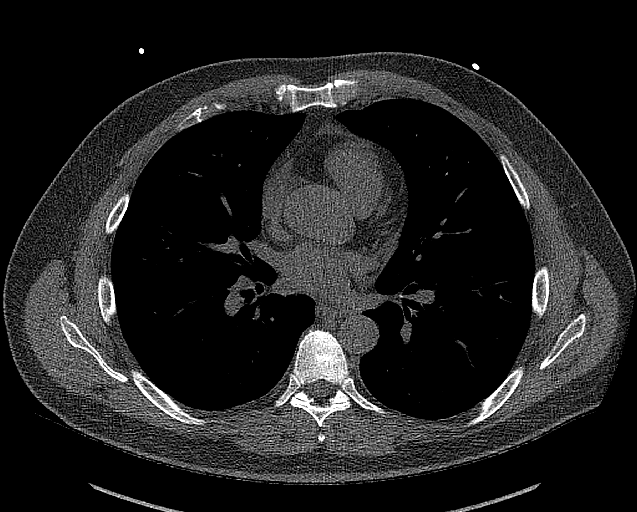
[im 66/80  vessel]
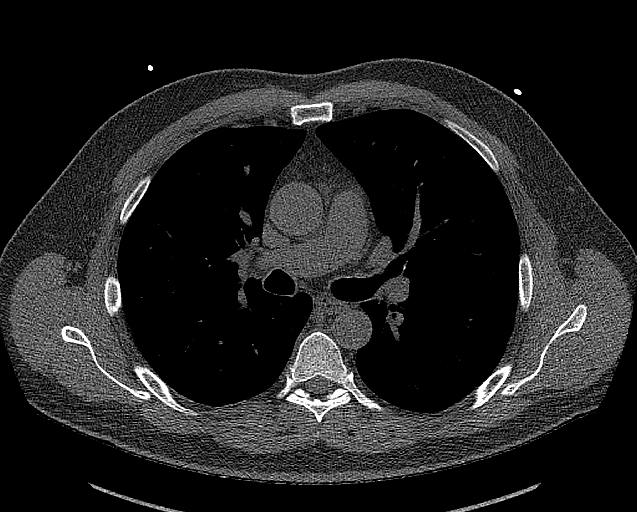

[14 of 20 positions shown; findings below may reference images not displayed]

FINDINGS: CORONARY CALCIUM SCORES:

Left Main: 0

LAD: 0

LCx: 0

RCA:

Total Agatston Score:

[HOSPITAL] percentile: 54

AORTA MEASUREMENTS:

Ascending Aorta: 31 mm

Descending Aorta: 23 mm

OTHER FINDINGS:

The heart size is within normal limits. No pericardial fluid is
identified. Visualized segments of the thoracic aorta and central
pulmonary arteries are normal in caliber. Visualized mediastinum and
hilar regions demonstrate no lymphadenopathy or masses. There is a
small hiatal hernia. Visualized lungs show no evidence of pulmonary
edema, consolidation, pneumothorax, nodule or pleural fluid.
Visualized upper abdomen and bony structures are unremarkable.
IMPRESSION: Coronary calcium score of 4.6 is at the 54th percentile for the
patient's age, sex and race.

## 2023-04-27 DIAGNOSIS — E7800 Pure hypercholesterolemia, unspecified: Secondary | ICD-10-CM | POA: Diagnosis not present

## 2023-04-27 DIAGNOSIS — I2510 Atherosclerotic heart disease of native coronary artery without angina pectoris: Secondary | ICD-10-CM | POA: Diagnosis not present

## 2023-04-27 DIAGNOSIS — R7303 Prediabetes: Secondary | ICD-10-CM | POA: Diagnosis not present

## 2023-04-27 DIAGNOSIS — G4733 Obstructive sleep apnea (adult) (pediatric): Secondary | ICD-10-CM | POA: Diagnosis not present

## 2023-05-04 ENCOUNTER — Other Ambulatory Visit: Payer: Self-pay

## 2023-05-04 ENCOUNTER — Encounter (HOSPITAL_BASED_OUTPATIENT_CLINIC_OR_DEPARTMENT_OTHER): Payer: Self-pay

## 2023-05-04 ENCOUNTER — Emergency Department (HOSPITAL_BASED_OUTPATIENT_CLINIC_OR_DEPARTMENT_OTHER)
Admission: EM | Admit: 2023-05-04 | Discharge: 2023-05-04 | Disposition: A | Discharge status: Home / Self Care | Attending: Emergency Medicine | Admitting: Emergency Medicine

## 2023-05-04 ENCOUNTER — Emergency Department (HOSPITAL_BASED_OUTPATIENT_CLINIC_OR_DEPARTMENT_OTHER): Admitting: Radiology

## 2023-05-04 DIAGNOSIS — R0602 Shortness of breath: Secondary | ICD-10-CM | POA: Diagnosis not present

## 2023-05-04 DIAGNOSIS — R079 Chest pain, unspecified: Secondary | ICD-10-CM

## 2023-05-04 DIAGNOSIS — R0789 Other chest pain: Secondary | ICD-10-CM | POA: Diagnosis not present

## 2023-05-04 LAB — D-DIMER, QUANTITATIVE: D-Dimer, Quant: <0.27 ug{FEU}/mL (ref 0.00–0.50)

## 2023-05-04 LAB — CBC
HCT: 43.3 % (ref 39.0–52.0)
Hemoglobin: 15.1 g/dL (ref 13.0–17.0)
MCH: 29.8 pg (ref 26.0–34.0)
MCHC: 34.9 g/dL (ref 30.0–36.0)
MCV: 85.4 fL (ref 80.0–100.0)
Platelets: 223 10*3/uL (ref 150–400)
RBC: 5.07 MIL/uL (ref 4.22–5.81)
RDW: 12.1 % (ref 11.5–15.5)
WBC: 6.2 10*3/uL (ref 4.0–10.5)
nRBC: 0 % (ref 0.0–0.2)

## 2023-05-04 LAB — BRAIN NATRIURETIC PEPTIDE: B Natriuretic Peptide: 9 pg/mL (ref 0.0–100.0)

## 2023-05-04 LAB — BASIC METABOLIC PANEL WITH GFR
Anion gap: 10 (ref 5–15)
BUN: 16 mg/dL (ref 6–20)
CO2: 26 mmol/L (ref 22–32)
Calcium: 9.6 mg/dL (ref 8.9–10.3)
Chloride: 103 mmol/L (ref 98–111)
Creatinine, Ser: 1.03 mg/dL (ref 0.61–1.24)
GFR, Estimated: >60 mL/min (ref >60)
Glucose, Bld: 107 mg/dL — ABNORMAL HIGH (ref 70–99)
Potassium: 3.8 mmol/L (ref 3.5–5.1)
Sodium: 139 mmol/L (ref 135–145)

## 2023-05-04 LAB — TROPONIN I (HIGH SENSITIVITY): Troponin I (High Sensitivity): 3 ng/L (ref <18)

## 2023-05-04 MED ORDER — LIDOCAINE VISCOUS HCL 2 % MT SOLN
15.0000 mL | Freq: Once | OROMUCOSAL | Status: AC
  Administered 2023-05-04: 15 mL via ORAL
  Filled 2023-05-04: qty 15

## 2023-05-04 MED ORDER — ALUM & MAG HYDROXIDE-SIMETH 200-200-20 MG/5ML PO SUSP
30.0000 mL | Freq: Once | ORAL | Status: AC
  Administered 2023-05-04: 30 mL via ORAL
  Filled 2023-05-04: qty 30

## 2023-05-04 NOTE — ED Triage Notes (Signed)
 In for eval of pressure in left chest onset Sunday with slight SOB yesterday. Feel like he has had indigestion x2 days and a soreness to left arm that he attributed to lifting weights. Denies nausea, vomiting, or diaphoresis.

## 2023-05-04 NOTE — ED Provider Notes (Signed)
 [place] EMERGENCY DEPARTMENT AT [facility] Provider Note   CSN: [id number] Arrival date & time: 05/04/23  6213     History  Chief Complaint  Patient presents with   Chest Pain    COLONEL KRAUSER is a 58 y.o. male.   Chest Pain Associated symptoms: shortness of breath   Associated symptoms: no cough and no fever      58 year old male with medical history significant for GERD/Barrett's esophagus, OSA on CPAP, presenting to the emergency department with chest pain.  The patient states that he developed symptoms of GERD starting on Saturday.  He developed left-sided chest discomfort with mild shortness of breath on exertion that started on Sunday.  He noticed some dyspnea while climbing stairs.  He endorses some radiation of discomfort down his arm however he worked out he also played golf without significant symptoms during that same period of time.  No history of CAD/MI, no history of PE/DVT.  He denies any lower extremity swelling or crampy pain or discomfort.  He denies any fevers, chills or cough.  Home Medications Prior to Admission medications   Medication Sig Start Date End Date Taking? Authorizing Provider  Calcium Carb-Cholecalciferol 600-200 MG-UNIT TABS Take 1 tablet by mouth daily.   Yes [provider]  co-enzyme Q-10 30 MG capsule Take 30 mg by mouth daily.   Yes [provider]  fluticasone (FLONASE) 50 MCG/ACT nasal spray Place into both nostrils daily.   Yes [provider]  GLUCOSAMINE-CHONDROIT-MSM-C-MN PO Take 1 tablet by mouth daily.   Yes [provider]  Boris Lown Oil 300 MG CAPS Take by mouth.   Yes [provider]  loratadine (CLARITIN) 10 MG tablet Take 10 mg by mouth daily.   Yes [provider]  Multiple Vitamin (MULTIVITAMIN) tablet Take 1 tablet by mouth daily.   Yes [provider]  omeprazole (PRILOSEC) 20 MG capsule Take 20 mg by mouth 2 (two) times daily before a meal.    Yes  [provider]  rosuvastatin (CRESTOR) 5 MG tablet Take 5 mg by mouth daily. 07/14/21  Yes [provider]  tirzepatide (ZEPBOUND) 10 MG/0.5ML Pen Inject 10 mg into the skin once a week.   Yes [provider]  vitamin C (ASCORBIC ACID) 500 MG tablet Take 500 mg by mouth daily.   Yes [provider]      Allergies    Patient has no known allergies.    Review of Systems   Review of Systems  Constitutional:  Negative for chills and fever.  Respiratory:  Positive for shortness of breath. Negative for cough.   Cardiovascular:  Positive for chest pain. Negative for leg swelling.  All other systems reviewed and are negative.   Physical Exam Updated Vital Signs BP (!) 157/95 (BP Location: Right Arm)   Pulse 89   Temp 97.8 F (36.6 C)   Resp 16   Ht 5\' 5"  (1.651 m)   Wt 83.5 kg   SpO2 100%   BMI 30.62 kg/m  Physical Exam Vitals and nursing note reviewed.  Constitutional:      General: He is not in acute distress.    Appearance: He is well-developed.  HENT:     Head: Normocephalic and atraumatic.  Eyes:     Conjunctiva/sclera: Conjunctivae normal.  Cardiovascular:     Rate and Rhythm: Normal rate and regular rhythm.     Heart sounds: No murmur heard. Pulmonary:     Effort: Pulmonary effort is  normal. No respiratory distress.     Breath sounds: Normal breath sounds.  Abdominal:     Palpations: Abdomen is soft.     Tenderness: There is no abdominal tenderness.  Musculoskeletal:        General: No swelling.     Cervical back: Neck supple.  Skin:    General: Skin is warm and dry.     Capillary Refill: Capillary refill takes less than 2 seconds.  Neurological:     Mental Status: He is alert.  Psychiatric:        Mood and Affect: Mood normal.     ED Results / Procedures / Treatments   Labs (all labs ordered are listed, but only abnormal results are displayed) Labs Reviewed  BASIC METABOLIC PANEL WITH GFR - Abnormal; Notable for the  following components:      Result Value   Glucose, Bld 107 (*)    All other components within normal limits  CBC  D-DIMER, QUANTITATIVE  BRAIN NATRIURETIC PEPTIDE  TROPONIN I (HIGH SENSITIVITY)    EKG EKG Interpretation Date/Time:  Tuesday May 04 2023 07:30:46 EDT Ventricular Rate:  89 PR Interval:  166 QRS Duration:  82 QT Interval:  366 QTC Calculation: 445 R Axis:   11  Text Interpretation: Sinus rhythm with marked sinus arrhythmia Otherwise normal ECG No previous ECGs available Confirmed by Ernie Avena ([id number]) on 05/04/2023 9:25:30 AM  Radiology DG Chest 2 View Result Date: 05/04/2023 CLINICAL DATA:  Chest pain. EXAM: CHEST - 2 VIEW COMPARISON:  None Available. FINDINGS: The heart size and mediastinal contours are within normal limits. Both lungs are clear. The visualized skeletal structures are unremarkable. IMPRESSION: No active cardiopulmonary disease. Electronically Signed   By: Almira Bar M.D.   On: 05/04/2023 07:53    Procedures Ultrasound ED Echo  Date/Time: 05/04/2023 9:58 AM  Performed by: Ernie Avena, MD Authorized by: Ernie Avena, MD   Procedure details:    Indications: chest pain     Views: subxiphoid, parasternal long axis view, parasternal short axis view and IVC view     Images: not archived     Limitations:  Acoustic shadowing Findings:    Pericardium: no pericardial effusion     LV Function: normal (>50% EF)     RV Diameter: normal     IVC: normal   Impression:    Impression: normal       Medications Ordered in ED Medications  alum & mag hydroxide-simeth (MAALOX/MYLANTA) 200-200-20 MG/5ML suspension 30 mL (30 mLs Oral Given 05/04/23 0943)    And  lidocaine (XYLOCAINE) 2 % viscous mouth solution 15 mL (15 mLs Oral Given 05/04/23 9147)    ED Course/ Medical Decision Making/ A&P             HEART Score: 3                    Medical Decision Making Amount and/or Complexity of Data Reviewed Labs: ordered. Radiology:  ordered.  Risk OTC drugs. Prescription drug management.    58 year old male with medical history significant for GERD/Barrett's esophagus, OSA on CPAP, presenting to the emergency department with chest pain.  The patient states that he developed symptoms of GERD starting on Saturday.  He developed left-sided chest discomfort with mild shortness of breath on exertion that started on Sunday.  He noticed some dyspnea while climbing stairs.  He endorses some radiation of discomfort down his arm however he worked out he also played golf without significant  symptoms during that same period of time.  No history of CAD/MI, no history of PE/DVT.  He denies any lower extremity swelling or crampy pain or discomfort.  He denies any fevers, chills or cough.  Medical Decision Making: MAYFORD ALBERG is a 58 y.o. male who presented to the ED today with chest pain, detailed above.  Based on patient's comorbidities, patient has a heart score of 3.     Complete initial physical exam performed, notably the patient was CTAB.   Reviewed and confirmed nursing documentation for past medical history, family history, social history.    Initial Assessment: With the patient's presentation of left-sided chest pain, most likely diagnosis is musculoskeletal chest pain versus GERD, although ACS remains on the differential. Other diagnoses were considered including (but not limited to) pulmonary embolism, community-acquired pneumonia, aortic dissection, pneumothorax, underlying bony abnormality, anemia. These are considered less likely due to history of present illness and physical exam findings.    In particular, concerning pulmonary embolism: Patient is PERC positive and the they deny malignancy, recent surgery, history of DVT, or calf tenderness leading to a low risk Wells score. Aortic Dissection also considered but seems less likely based on the location, quality, onset, and severity of symptoms in this case.   Initial  Plan: Evaluate for ACS with single troponin and EKG evaluated as below given duration of symptoms Evaluate for dissection, bony abnormality, or pneumonia with chest x-ray and screening laboratory evaluation including CBC, BMP  Further evaluation for pulmonary embolism indicated at this time based on patient's PERC and Wells score.  Further evaluation for Thoracic Aortic Dissection not indicated at this time based on patient's clinical history and PE findings.   Initial Study Results: EKG was reviewed independently. Rate, rhythm, axis, intervals all examined and without medically relevant abnormality. ST segments without concerns for elevations.    Laboratory  Single troponin demonstrated normal value of 3.  Dimer negative. BNP normal.   CBC and BMP without obvious metabolic or inflammatory abnormalities requiring further evaluation   Radiology  DG Chest 2 View Result Date: 05/04/2023 CLINICAL DATA:  Chest pain. EXAM: CHEST - 2 VIEW COMPARISON:  None Available. FINDINGS: The heart size and mediastinal contours are within normal limits. Both lungs are clear. The visualized skeletal structures are unremarkable. IMPRESSION: No active cardiopulmonary disease. Electronically Signed   By: Denman Fischer M.D.   On: 05/04/2023 07:53    Final Assessment and Plan: The patient is a heart score of 3.  He has had symptoms for the past few days.  Initial cardiac troponin was 3 and an EKG was overall reassuring and a chest x-ray revealed clear lungs without active disease.  A BNP was normal and a D-dimer was negative.  A bedside cardiac ultrasound was performed with no evidence of pericardial effusion.  Symptoms could be due to GERD versus musculoskeletal etiology.  Patient had exercised and played 9 holes of golf without exertional discomfort or shortness of breath.  He has not had an outpatient cardiac workup.  After a GI cocktail, the patient was feeling symptomatically improved.  Given his low risk heart  score and reassuring workup in the emergency department today, I did recommend outpatient follow-up with his PCP and/or a cardiologist for consideration for outpatient cardiac stress testing for further restratification.  Return precautions provided, patient overall stable for discharge at this time.  Final Clinical Impression(s) / ED Diagnoses Final diagnoses:  Chest pain, unspecified type    Rx / DC Orders ED  Discharge Orders          Ordered    Ambulatory referral to Cardiology       Comments: If you have not heard from the Cardiology office within the next 72 hours please call [phone].   05/04/23 6295              Rosealee Concha, MD 05/04/23 1018

## 2023-05-04 NOTE — Discharge Instructions (Addendum)
 Your EKG, chest x-ray, laboratory evaluation was overall reassuring.  Your cardiac risk score is low risk.  You are stable for discharge and outpatient management at this time.  Your symptoms could be due to worsening reflux versus a musculoskeletal etiology.

## 2023-06-16 DIAGNOSIS — R7401 Elevation of levels of liver transaminase levels: Secondary | ICD-10-CM | POA: Diagnosis not present

## 2023-09-16 NOTE — Progress Notes (Signed)
 PATIENT: Cole Simpson DOB: March 17, 1965  REASON FOR VISIT: follow up HISTORY FROM: patient  Chief Complaint  Patient presents with   Follow-up    Pt in room 1. Alone. Here for cpap follow up. Pt reports doing well on cpap.      HISTORY OF PRESENT ILLNESS:  09/21/2023 ALL: Cole Simpson returns for follow up for OSA on CPAP. He continues to do well on therapy. He is using CPAP nightly for about 7-8 hours, on average. He denies concerns with machine or supplies. He is sleeping well. He did fall asleep last week prior to starting therapy and noted feeling tired and sluggish when he woke. He is followed regularly by PCP.     09/14/2022 ALL: Cole Simpson returns for follow up for OSA on CPAP. He continues to do well. He is using therapy nightly for about 8 hours, on average. He denies concerns with machine or supplies. He does note significant improvement in sleep quality when using therapy.   Machine set up in 2019. He is not interested in getting a new machine at this time.     09/11/2021 ALL: Cole Simpson returns for follow up for OSA on CPAP. He continues to do well. He is using CPAP nightly for about 7-8 hours. He has tried sleeping without CPAP and does not rest well. He has lost about 30lbs since 2019. He is taking Wegovy and tolerating well. He has 4 brothers with sleep apnea. No trouble with machine or supplies.      06/26/2020 ALL:  Cole Simpson returns for follow up for OSA on CPAP.      06/21/2019 ALL:  Cole Simpson is a 58 y.o. male here today for follow up for OSA on CPAP.  He is doing very well with CPAP at home.  He is using CPAP nightly.  He does note significant in sleep quality.  He denies any concerns today.  Compliance report dated 12/20/2019 through 06/18/2019 reveals that he used CPAP thirty of the past 30 days for compliance of 100%.  The CPAP greater than 4 hours 100% of the time.  Average usage was 7 hours and 53 minutes.  Residual AHI was 0.5 on 5 to 15 cm of water and an EPR of  three.  There is no significant leak noted.  HISTORY: (copied from my note on 06/21/2018)  MAYANK TEUSCHER is a 58 y.o. male for follow up of OSA on CPAP.  He states that he is doing well with CPAP therapy.  He does use his machine nearly every night.  He admits that occasionally when he goes to the beach he does not take his CPAP therapy.  He is in need of supplies today.   05/21/2018 - 06/19/2018 020 Usage days 27/30 days (90%) >= 4 hours 27 days (90%) < 4 hours 0 days (0%) Usage hours 222 hours 5 minutes Average usage (total days) 7 hours 24 minutes Average usage (days used) 8 hours 14 minutes Median usage (days used) 8 hours 9 minutes Total used hours (value since last reset - 06/19/2018) 3,740 hours AirSense 10 AutoSet Serial number [id number] Mode AutoSet Min Pressure 5 cmH2O Max Pressure 15 cmH2O EPR Fulltime EPR level 3 Response Standard Therapy Pressure - cmH2O Median: 6.7 95th percentile: 9.6 Maximum: 10.9 Leaks - L/min Median: 0.0 95th percentile: 1.3 Maximum: 5.9 Events per hour AI: 0.6 HI: 0.1 AHI: 0.7 Apnea Index Central: 0.0 Obstructive: 0.6 Unknown: 0.0 RERA Index 0.2 Cheyne-Stokes respiration (average duration per night) 0  minutes (0%) Usage - hours Printed on   HISTORY (copied from [employer] note on 04/15/2017)   UPDATE 3/28/2019CM Mr. Flax, 58 year old male returns for follow-up for initial CPAP compliance after recently being diagnosed with obstructive sleep apnea.  He is having no problems with adjustment.  He has nasal pillows.  Compliance data dated 03/16/2017-04/14/2017 shows compliance greater than 4 hours for 30 days at 100%.  Average usage 7 hours 37 minutes.  Set pressure 5-15 cm.  EPR level 3 AHI 0.8.  ESS 2.  He returns for reevaluation 12/15/17 CDRobert J Linsey is a 58 y.o. male , seen here  in a referral  from Dr. Luke for a sleep consultation.   I had the pleasure of meeting Mr. ERNESTO ZUKOWSKI today, a 59 year old Caucasian, married,  left-handed male patient of Dr. Lynwood Luke on 15 December 2016. The patient is married to a nurse and reports that he has also occurred that his brothers have noted his snoring.  He has 4 brothers of which 3 are on CPAP. He has seasonal allergies culminating and allergic rhinitis, enlarged adenoids.  Aside from snoring and allergies he has suffered from GERD.   Chief complaint according to patient : my wife and children are bothered by my snoring and I feel more tired in the morning   Sleep habits are as follows:  The patient's usual bedtime is between 10 and 11 PM, his bedroom is cool, quiet and dark and he shares it with his wife.  He sleeps on 2 pillows usually on his sides, his wife has noticed that snoring is louder when he rests on his right side.   He has once a night a bathroom break.  Otherwise he will sleeps through until the morning hours when he spontaneously awakens around 5:50 AM, he also has an alarm set. He cannot recall vivid dreams, at least no lately.  He is just not as ready to start the day.  He awakens with a dry mouth, but neither headaches, no nausea no dizziness were endorsed.  He does not have palpitations, he is not sweaty or clammy and he does not have chest pain.  REVIEW OF SYSTEMS: Out of a complete 14 system review of symptoms, the patient complains only of the following symptoms, none and all other reviewed systems are negative.  ESS: 3/24 FSS: 25  ALLERGIES: No Known Allergies  HOME MEDICATIONS: Outpatient Medications Prior to Visit  Medication Sig Dispense Refill   Calcium Carb-Cholecalciferol 600-200 MG-UNIT TABS Take 1 tablet by mouth daily.     co-enzyme Q-10 30 MG capsule Take 30 mg by mouth daily.     fluticasone (FLONASE) 50 MCG/ACT nasal spray Place into both nostrils daily.     GLUCOSAMINE-CHONDROIT-MSM-C-MN PO Take 1 tablet by mouth daily.     Krill Oil 300 MG CAPS Take by mouth.     loratadine (CLARITIN) 10 MG tablet Take 10 mg by mouth daily.      Multiple Vitamin (MULTIVITAMIN) tablet Take 1 tablet by mouth daily.     omeprazole (PRILOSEC) 20 MG capsule Take 20 mg by mouth 2 (two) times daily before a meal.      rosuvastatin (CRESTOR) 5 MG tablet Take 5 mg by mouth daily.     tirzepatide (ZEPBOUND) 10 MG/0.5ML Pen Inject 10 mg into the skin once a week.     vitamin C (ASCORBIC ACID) 500 MG tablet Take 500 mg by mouth daily.     No facility-administered medications prior  to visit.    PAST MEDICAL HISTORY: Past Medical History:  Diagnosis Date   GERD (gastroesophageal reflux disease)    OSA on CPAP 2018    PAST SURGICAL HISTORY: Past Surgical History:  Procedure Laterality Date   esophageal mucosa resection      FAMILY HISTORY: Family History  Problem Relation Age of Onset   Brain cancer Mother    Heart failure Father     SOCIAL HISTORY: Social History   Socioeconomic History   Marital status: Married    Spouse name: Not on file   Number of children: Not on file   Years of education: Not on file   Highest education level: Not on file  Occupational History   Not on file  Tobacco Use   Smoking status: Former   Smokeless tobacco: Never   Tobacco comments:    quit 20 years ago  Vaping Use   Vaping status: Never Used  Substance and Sexual Activity   Alcohol use: Yes    Comment: occasionally   Drug use: No   Sexual activity: Not on file  Other Topics Concern   Not on file  Social History Narrative   Not on file   Social Drivers of Health   Financial Resource Strain: Not on file  Food Insecurity: Not on file  Transportation Needs: Not on file  Physical Activity: Not on file  Stress: Not on file  Social Connections: Not on file  Intimate Partner Violence: Not on file      PHYSICAL EXAM  Vitals:   09/21/23 0748  BP: 115/75  Pulse: 77  Weight: 184 lb 3.2 oz (83.6 kg)  Height: 5' 5 (1.651 m)    Mallampati: 4 Neck cir: 17  Body mass index is 30.65 kg/m.  Generalized: Well developed,  in no acute distress  Cardiology: normal rate and rhythm, no murmur noted Respiratory: clear to auscultation bilaterally  Neurological examination  Mentation: Alert oriented to time, place, history taking. Follows all commands speech and language fluent Cranial nerve II-XII: Pupils were equal round reactive to light. Extraocular movements were full, visual field were full  Motor: The motor testing reveals 5 over 5 strength of all 4 extremities. Good symmetric motor tone is noted throughout.  Gait and station: Gait is normal.   DIAGNOSTIC DATA (LABS, IMAGING, TESTING) - I reviewed patient records, labs, notes, testing and imaging myself where available.      No data to display           Lab Results  Component Value Date   WBC 6.2 05/04/2023   HGB 15.1 05/04/2023   HCT 43.3 05/04/2023   MCV 85.4 05/04/2023   PLT 223 05/04/2023      Component Value Date/Time   NA 139 05/04/2023 0800   K 3.8 05/04/2023 0800   CL 103 05/04/2023 0800   CO2 26 05/04/2023 0800   GLUCOSE 107 (H) 05/04/2023 0800   BUN 16 05/04/2023 0800   CREATININE 1.03 05/04/2023 0800   CALCIUM 9.6 05/04/2023 0800   GFRNONAA >60 05/04/2023 0800   No results found for: CHOL, HDL, LDLCALC, LDLDIRECT, TRIG, CHOLHDL No results found for: YHAJ8R No results found for: VITAMINB12 No results found for: TSH     ASSESSMENT AND PLAN 58 y.o. year old male  has a past medical history of GERD (gastroesophageal reflux disease) and OSA on CPAP (2018). here with     ICD-10-CM   1. Obstructive sleep apnea treated with continuous positive airway pressure (CPAP)  G47.33        He continues to do very well with CPAP therapy. Compliance report reveals excellent compliance. He was encouraged to continue CPAP nightly and greater than 4 hours each night. Supply orders placed today. We have discussed replacing CPAP. He does not wish to start process at this time and will call me if he has any concerns with his  machine. Healthy lifestyle habits encouraged. He will follow up in 1 year, sooner if needed. He verbalizes understanding and agreement with this plan.    No orders of the defined types were placed in this encounter.    No orders of the defined types were placed in this encounter.    Greig Forbes, FNP-C 09/21/2023, 7:52 AM [facility] [address] [place], [place] [address] [phone]

## 2023-09-17 NOTE — Progress Notes (Signed)
 Cole Simpson

## 2023-09-21 ENCOUNTER — Ambulatory Visit (INDEPENDENT_AMBULATORY_CARE_PROVIDER_SITE_OTHER): Payer: BC Managed Care – PPO | Admitting: Family Medicine

## 2023-09-21 ENCOUNTER — Encounter: Payer: Self-pay | Admitting: Family Medicine

## 2023-09-21 VITALS — BP 115/75 | HR 77 | Ht 65.0 in | Wt 184.2 lb

## 2023-09-21 DIAGNOSIS — G4733 Obstructive sleep apnea (adult) (pediatric): Secondary | ICD-10-CM | POA: Diagnosis not present

## 2023-09-21 NOTE — Patient Instructions (Signed)

## 2023-09-28 DIAGNOSIS — I2510 Atherosclerotic heart disease of native coronary artery without angina pectoris: Secondary | ICD-10-CM | POA: Diagnosis not present

## 2023-09-28 DIAGNOSIS — G4733 Obstructive sleep apnea (adult) (pediatric): Secondary | ICD-10-CM | POA: Diagnosis not present

## 2023-09-28 DIAGNOSIS — R7303 Prediabetes: Secondary | ICD-10-CM | POA: Diagnosis not present

## 2023-09-28 DIAGNOSIS — E7800 Pure hypercholesterolemia, unspecified: Secondary | ICD-10-CM | POA: Diagnosis not present

## 2023-10-06 DIAGNOSIS — K2270 Barrett's esophagus without dysplasia: Secondary | ICD-10-CM | POA: Diagnosis not present

## 2023-11-04 DIAGNOSIS — K449 Diaphragmatic hernia without obstruction or gangrene: Secondary | ICD-10-CM | POA: Diagnosis not present

## 2023-11-04 DIAGNOSIS — K2270 Barrett's esophagus without dysplasia: Secondary | ICD-10-CM | POA: Diagnosis not present
